# Patient Record
Sex: Male | Born: 1970 | Race: White | Hispanic: No | Smoking: Former smoker
Health system: Southern US, Community
[De-identification: ages and names within clinical notes are randomized; demographics above are authoritative.]

## PROBLEM LIST (undated history)

## (undated) DIAGNOSIS — M2550 Pain in unspecified joint: Secondary | ICD-10-CM

## (undated) DIAGNOSIS — R252 Cramp and spasm: Secondary | ICD-10-CM

## (undated) DIAGNOSIS — M79673 Pain in unspecified foot: Secondary | ICD-10-CM

## (undated) DIAGNOSIS — K219 Gastro-esophageal reflux disease without esophagitis: Secondary | ICD-10-CM

## (undated) DIAGNOSIS — E039 Hypothyroidism, unspecified: Secondary | ICD-10-CM

## (undated) DIAGNOSIS — E559 Vitamin D deficiency, unspecified: Secondary | ICD-10-CM

## (undated) DIAGNOSIS — K829 Disease of gallbladder, unspecified: Secondary | ICD-10-CM

## (undated) DIAGNOSIS — M549 Dorsalgia, unspecified: Secondary | ICD-10-CM

## (undated) DIAGNOSIS — N529 Male erectile dysfunction, unspecified: Secondary | ICD-10-CM

## (undated) HISTORY — DX: Gastro-esophageal reflux disease without esophagitis: K21.9

## (undated) HISTORY — DX: Disease of gallbladder, unspecified: K82.9

## (undated) HISTORY — DX: Hypothyroidism, unspecified: E03.9

## (undated) HISTORY — DX: Dorsalgia, unspecified: M54.9

## (undated) HISTORY — DX: Pain in unspecified foot: M79.673

## (undated) HISTORY — DX: Vitamin D deficiency, unspecified: E55.9

## (undated) HISTORY — PX: ACHILLES TENDON LENGTHENING: SUR826

## (undated) HISTORY — DX: Cramp and spasm: R25.2

## (undated) HISTORY — PX: CHOLECYSTECTOMY: SHX55

## (undated) HISTORY — DX: Pain in unspecified joint: M25.50

## (undated) HISTORY — DX: Male erectile dysfunction, unspecified: N52.9

---

## 2008-06-02 LAB — GLYCO-HEMOGLOBIN A1C
Estimated Average Glucose: 117 mg/dL
Hemoglobin A1C: 5.7 % (ref 4.3–6.1)

## 2008-06-02 LAB — LITHIUM LEVEL: Lithium Level: 0.1 mmol/L — CL (ref 0.5–1.2)

## 2008-06-07 LAB — LITHIUM LEVEL: Lithium Level: 0.2 mmol/L — ABNORMAL LOW (ref 0.5–1.2)

## 2008-07-07 LAB — LITHIUM LEVEL: Lithium Level: 0.4 mmol/L — ABNORMAL LOW (ref 0.5–1.2)

## 2008-09-30 LAB — COMPREHENSIVE METABOLIC PANEL
ALT - Alanine Amino transferase: 38 IU/L (ref 5–40)
AST - Aspartate Aminotransferase: 28 IU/L (ref 10–42)
Albumin/Globulin Ratio: 1.1 (ref >0.9)
Albumin: 4 gm/dL (ref 3.5–5.0)
Alkaline Phosphatase: 60 IU/L (ref 37–107)
Anion Gap: 8 mmol/L (ref 3–11)
BUN: 14 mg/dL (ref 8–21)
Bilirubin, Total: 0.8 mg/dL (ref 0.2–1.2)
CO2 - Carbon Dioxide: 26 mmol/L (ref 22–31)
Calcium: 8.9 mg/dL (ref 8.5–10.5)
Chloride: 106 mmol/L (ref 98–111)
Creatinine, Serum: 1 mg/dL (ref 0.6–1.3)
GFR Estimate: >60 mL/min/{1.73_m2} (ref >60)
Globulin: 3.6 gm/dL (ref 2.2–3.7)
Glucose: 100 mg/dL — ABNORMAL HIGH (ref 80–99)
Potassium: 3.8 mmol/L (ref 3.5–5.1)
Protein, Total: 7.6 gm/dL (ref 6.1–7.9)
Sodium: 140 mmol/L (ref 133–147)

## 2008-09-30 LAB — CBC WITH AUTO DIFFERENTIAL
Basophils %: 0 % (ref 0–2)
Basophils, Absolute: 0 10*3/uL (ref 0–0.2)
Eosinophils %: 1 % (ref 0–7)
Eosinophils, Absolute: 0.1 10*3/uL (ref 0–0.7)
HCT: 45.2 % (ref 42.0–54.0)
Hgb: 15.5 gm/dL (ref 12.0–18.0)
Lymphocytes %: 25 % (ref 25–45)
Lymphocytes, Absolute: 2.5 10*3/uL (ref 1.1–4.3)
MCH: 30.2 pg (ref 27–34)
MCHC: 34.3 gm/dL (ref 32–36)
MCV: 87.9 fL (ref 81–99)
MPV: 7.4 fL (ref 7.4–10.4)
Monocytes %: 8 % (ref 0–12)
Monocytes, Absolute: 0.8 10*3/uL (ref 0–1.2)
Neutrophils, Absolute: 6.3 10*3/uL (ref 1.6–7.3)
Platelet Count: 238 10*3/uL (ref 150–400)
RBC: 5.14 10*6/uL (ref 4.70–6.10)
RDW: 13.1 % (ref 11.5–14.5)
Segs (Neutrophils)%: 66 % (ref 35–70)
WBC: 9.8 10*3/uL (ref 4.8–10.8)

## 2008-09-30 LAB — LITHIUM LEVEL: Lithium Level: 0.2 mmol/L — ABNORMAL LOW (ref 0.5–1.2)

## 2009-01-06 LAB — COMPREHENSIVE METABOLIC PANEL
ALT - Alanine Amino transferase: 43 IU/L — ABNORMAL HIGH (ref 5–40)
AST - Aspartate Aminotransferase: 31 IU/L (ref 10–42)
Albumin/Globulin Ratio: 1.2 (ref >0.9)
Albumin: 4.1 gm/dL (ref 3.5–5.0)
Alkaline Phosphatase: 66 IU/L (ref 37–107)
Anion Gap: 7 mmol/L (ref 3–11)
BUN: 9 mg/dL (ref 8–21)
Bilirubin, Total: 0.6 mg/dL (ref 0.2–1.2)
CO2 - Carbon Dioxide: 26 mmol/L (ref 22–31)
Calcium: 9.5 mg/dL (ref 8.5–10.5)
Chloride: 106 mmol/L (ref 98–111)
Creatinine, Serum: 1.1 mg/dL (ref 0.6–1.3)
GFR Estimate: >60 mL/min/{1.73_m2} (ref >60)
Globulin: 3.5 gm/dL (ref 2.2–3.7)
Glucose: 97 mg/dL (ref 80–99)
Potassium: 4.1 mmol/L (ref 3.5–5.1)
Protein, Total: 7.6 gm/dL (ref 6.1–7.9)
Sodium: 139 mmol/L (ref 133–147)

## 2009-01-06 LAB — LITHIUM LEVEL: Lithium Level: 0.6 mmol/L (ref 0.5–1.2)

## 2009-07-19 LAB — LITHIUM LEVEL: Lithium Level: 0.2 mmol/L — ABNORMAL LOW (ref 0.5–1.2)

## 2009-08-08 LAB — LITHIUM LEVEL: Lithium Level: 0.4 mmol/L — ABNORMAL LOW (ref 0.5–1.2)

## 2009-08-18 ENCOUNTER — Ambulatory Visit: Payer: Self-pay | Admitting: Diagnostic Radiology

## 2009-08-18 ENCOUNTER — Emergency Department (HOSPITAL_BASED_OUTPATIENT_CLINIC_OR_DEPARTMENT_OTHER): Admission: EM | Admit: 2009-08-18 | Discharge: 2009-08-18 | Payer: Self-pay | Admitting: Emergency Medicine

## 2010-01-22 LAB — LITHIUM LEVEL: Lithium Level: <0.1 mmol/L — CL (ref 0.5–1.2)

## 2010-11-08 LAB — LITHIUM LEVEL: Lithium Level: 0.4 mmol/L — ABNORMAL LOW (ref 0.5–1.2)

## 2011-02-28 ENCOUNTER — Emergency Department (HOSPITAL_BASED_OUTPATIENT_CLINIC_OR_DEPARTMENT_OTHER)
Admission: EM | Admit: 2011-02-28 | Discharge: 2011-02-28 | Disposition: A | Discharge status: Home / Self Care | Payer: BC Managed Care – PPO | Attending: Emergency Medicine | Admitting: Emergency Medicine

## 2011-02-28 ENCOUNTER — Other Ambulatory Visit (HOSPITAL_BASED_OUTPATIENT_CLINIC_OR_DEPARTMENT_OTHER): Payer: Self-pay | Admitting: Family Medicine

## 2011-02-28 ENCOUNTER — Emergency Department (INDEPENDENT_AMBULATORY_CARE_PROVIDER_SITE_OTHER): Payer: BC Managed Care – PPO

## 2011-02-28 DIAGNOSIS — R1011 Right upper quadrant pain: Secondary | ICD-10-CM

## 2011-02-28 DIAGNOSIS — R52 Pain, unspecified: Secondary | ICD-10-CM

## 2011-02-28 DIAGNOSIS — R109 Unspecified abdominal pain: Secondary | ICD-10-CM | POA: Insufficient documentation

## 2011-02-28 DIAGNOSIS — F17200 Nicotine dependence, unspecified, uncomplicated: Secondary | ICD-10-CM | POA: Insufficient documentation

## 2011-02-28 DIAGNOSIS — K8020 Calculus of gallbladder without cholecystitis without obstruction: Secondary | ICD-10-CM | POA: Insufficient documentation

## 2011-02-28 LAB — DIFFERENTIAL
Basophils Absolute: 0 10*3/uL (ref 0.0–0.1)
Basophils Relative: 0 % (ref 0–1)
Eosinophils Absolute: 0 10*3/uL (ref 0.0–0.7)
Monocytes Absolute: 0.7 10*3/uL (ref 0.1–1.0)
Monocytes Relative: 4 % (ref 3–12)
Neutro Abs: 12.8 10*3/uL — ABNORMAL HIGH (ref 1.7–7.7)

## 2011-02-28 LAB — COMPREHENSIVE METABOLIC PANEL
Albumin: 4.5 g/dL (ref 3.5–5.2)
BUN: 19 mg/dL (ref 6–23)
Calcium: 10 mg/dL (ref 8.4–10.5)
Chloride: 105 mEq/L (ref 96–112)
Creatinine, Ser: 0.9 mg/dL (ref 0.4–1.5)
GFR calc Af Amer: >60 mL/min (ref >60)
Total Bilirubin: 0.7 mg/dL (ref 0.3–1.2)
Total Protein: 7.8 g/dL (ref 6.0–8.3)

## 2011-02-28 LAB — URINALYSIS, ROUTINE W REFLEX MICROSCOPIC
Ketones, ur: NEGATIVE mg/dL
Nitrite: NEGATIVE
Protein, ur: NEGATIVE mg/dL
Urobilinogen, UA: 0.2 mg/dL (ref 0.0–1.0)

## 2011-02-28 LAB — CBC
MCH: 30.5 pg (ref 26.0–34.0)
MCHC: 34.5 g/dL (ref 30.0–36.0)
Platelets: 238 10*3/uL (ref 150–400)
RDW: 13.2 % (ref 11.5–15.5)

## 2011-03-03 ENCOUNTER — Other Ambulatory Visit (HOSPITAL_BASED_OUTPATIENT_CLINIC_OR_DEPARTMENT_OTHER): Payer: Self-pay

## 2011-03-17 ENCOUNTER — Encounter (HOSPITAL_COMMUNITY): Payer: BC Managed Care – PPO | Attending: Surgery

## 2011-03-17 ENCOUNTER — Other Ambulatory Visit: Payer: Self-pay | Admitting: Surgery

## 2011-03-17 DIAGNOSIS — Z01812 Encounter for preprocedural laboratory examination: Secondary | ICD-10-CM | POA: Insufficient documentation

## 2011-03-17 LAB — CBC
Hemoglobin: 15.7 g/dL (ref 13.0–17.0)
MCV: 93.2 fL (ref 78.0–100.0)
Platelets: 270 10*3/uL (ref 150–400)
RBC: 5.17 MIL/uL (ref 4.22–5.81)
WBC: 10.2 10*3/uL (ref 4.0–10.5)

## 2011-03-17 LAB — SURGICAL PCR SCREEN: Staphylococcus aureus: POSITIVE — AB

## 2011-03-26 ENCOUNTER — Ambulatory Visit (HOSPITAL_COMMUNITY): Payer: BC Managed Care – PPO

## 2011-03-26 ENCOUNTER — Other Ambulatory Visit: Payer: Self-pay | Admitting: Surgery

## 2011-03-26 ENCOUNTER — Ambulatory Visit (HOSPITAL_COMMUNITY)
Admission: RE | Admit: 2011-03-26 | Discharge: 2011-03-26 | Disposition: A | Discharge status: Home / Self Care | Payer: BC Managed Care – PPO | Source: Ambulatory Visit | Attending: Surgery | Admitting: Surgery

## 2011-03-26 DIAGNOSIS — E039 Hypothyroidism, unspecified: Secondary | ICD-10-CM | POA: Insufficient documentation

## 2011-03-26 DIAGNOSIS — E669 Obesity, unspecified: Secondary | ICD-10-CM | POA: Insufficient documentation

## 2011-03-26 DIAGNOSIS — K8000 Calculus of gallbladder with acute cholecystitis without obstruction: Secondary | ICD-10-CM | POA: Insufficient documentation

## 2011-03-26 DIAGNOSIS — K8010 Calculus of gallbladder with chronic cholecystitis without obstruction: Secondary | ICD-10-CM | POA: Insufficient documentation

## 2011-03-26 DIAGNOSIS — Z79899 Other long term (current) drug therapy: Secondary | ICD-10-CM | POA: Insufficient documentation

## 2011-04-07 NOTE — Op Note (Signed)
NAME:  Gregory Schroeder, Gregory Schroeder             ACCOUNT NO.:  [id number]  MEDICAL RECORD NO.:  [id number]           PATIENT TYPE:  O  LOCATION:  DAYL                         FACILITY:  [facility]  PHYSICIAN:  Wilmon Arms. Corliss Skains, M.D. DATE OF BIRTH:  09-01-1971  DATE OF PROCEDURE:  03/26/2011 DATE OF DISCHARGE:  03/26/2011                              OPERATIVE REPORT   PREOPERATIVE DIAGNOSIS:  Chronic calculus cholecystitis.  POSTOPERATIVE DIAGNOSIS:  Chronic calculus cholecystitis.  PROCEDURE PERFORMED:  Laparoscopic cholecystectomy with intraoperative cholangiogram.  SURGEON:  Wilmon Arms. Bahja Bence, M.D.  ANESTHESIA:  General  INDICATIONS:  This is a 40 year old male who presents with several recent gallbladder attacks.  Ultrasound confirmed gallstones but no sign of cholecystitis.  Common bile duct and liver function tests were normal.  He presents now for cholecystectomy.  DESCRIPTION OF PROCEDURE:  The patient was brought to the operating room, placed in supine position on the operating room table.  After an adequate level of general anesthesia was obtained, the patient's abdomen was prepped with ChloraPrep and draped in sterile fashion.  Time-out was taken to assure the proper patient, proper procedure.  We infiltrated the area above the umbilicus with 0.25% Marcaine with epinephrine.  A transverse incision was made.  Dissection was carried down through the subcutaneous tissues with cautery and blunt dissection.  We incised the fascia vertically.  We entered the peritoneal cavity bluntly.  A stay suture of 0 Vicryl was placed around the fascial opening. Pneumoperitoneum was obtained by insufflating CO2 maintaining maximal pressure of 15 mmHg.  The laparoscope was inserted and the patient was positioned reverse Trendelenburg, tilted to his left.  An 11-mm port was placed in the subxiphoid position.  Two 5-mm ports were placed in the right upper quadrant.  The gallbladder was grasped with a  clamp and lifted.  There were lot of adhesions to the gallbladder.  We opened the peritoneum around the hilum of the gallbladder and dissected around the cystic duct and cystic artery.  The cystic artery was ligated with clips and divided.  The cystic duct was ligated, clipped distally.  A small opening was created in the cystic duct.  A Cook cholangiogram catheter was inserted through a stab incision threading the cystic duct.  A cholangiogram was obtained which showed good flow proximally and distally in biliary tree, no sign of filling defects.  Contrast flowed easily into duodenum.  The catheter was removed and cystic duct was ligated with clips and divided.  The gallbladder was then dissected free from the liver.  This was fairly difficult due to the thickened nature of the gallbladder.  The gallbladder was placed in EndoCatch sac.  We thoroughly irrigated the gallbladder fossa and used cautery for hemostasis.  A piece of Surgicel snow was packed in the gallbladder fossa.  The gallbladder EndoCatch sac was then removed.  The pursestring suture was used to close the umbilical fascia.  The 4-0 Monocryl was used to close the skin incisions.  Steri-Strips and clean dressings were applied.  The patient was extubated and brought to recovery room in stable condition. All sponge, instrument, and needle counts were correct.  Wilmon Arms. Corliss Skains, M.D.     MKT/MEDQ  D:  03/26/2011  T:  03/27/2011  Job:  [id number]  Electronically Signed by Manus Rudd M.D. on 04/07/2011 10:41:04 AM

## 2012-06-04 IMAGING — US US ABDOMEN COMPLETE
1 series · 13 of 25 positions shown · non-contrast
Comparison: None

CLINICAL DATA: 40-year-old male with abdominal pain.

ABDOMINAL ULTRASOUND COMPLETE

[Series 1: us abdomen complete · 0.43mm/px · 13 of 70 slices shown]
[im 1/70]
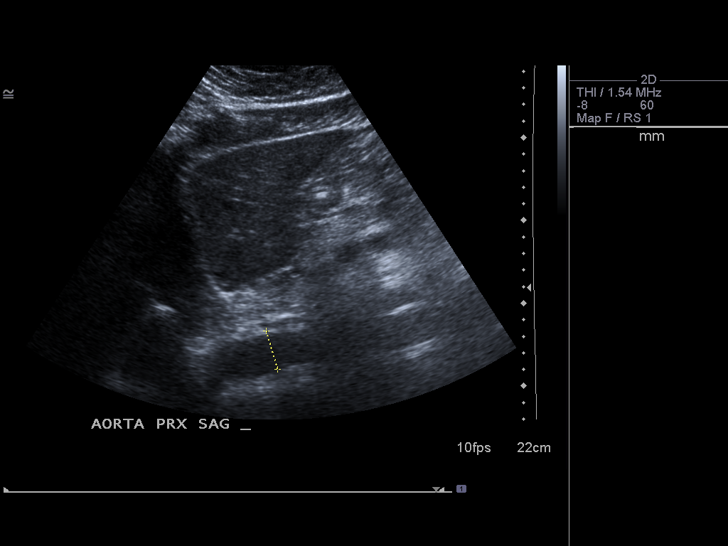
[im 6/70]
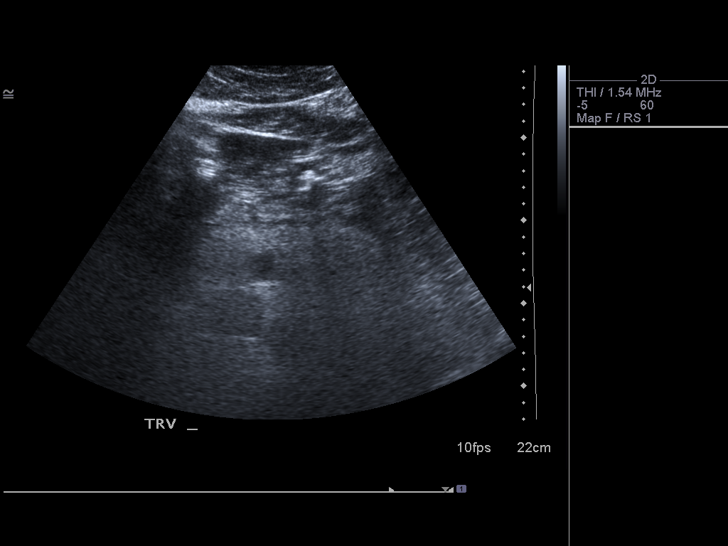
[im 12/70]
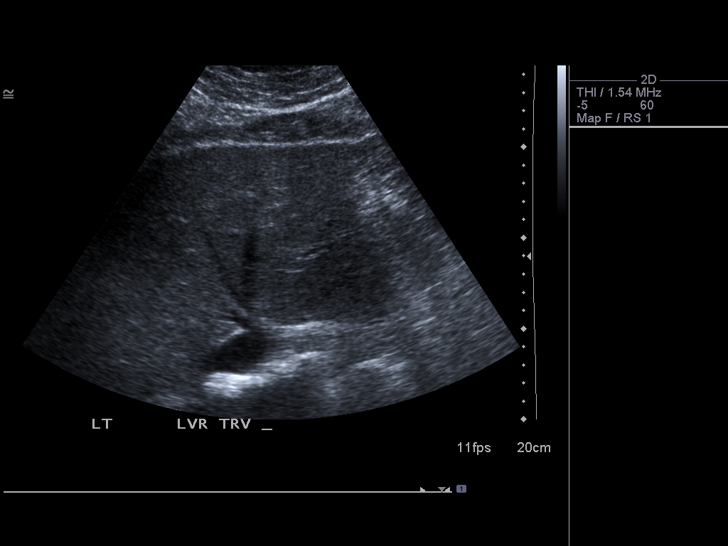
[im 18/70]
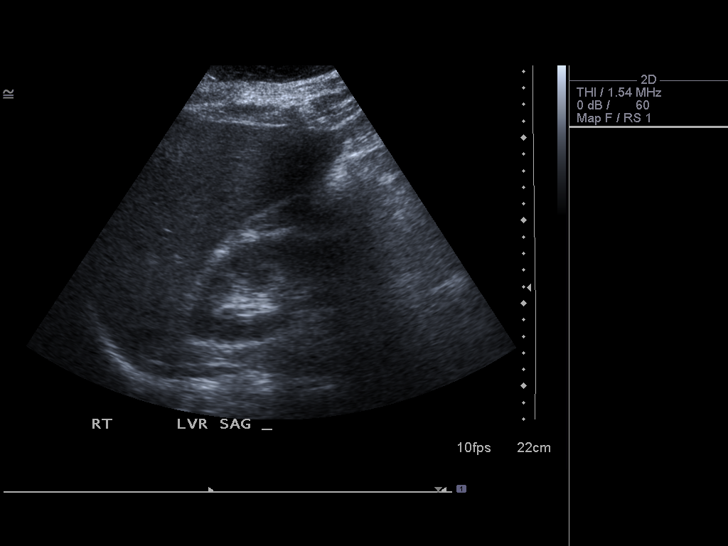
[im 24/70]
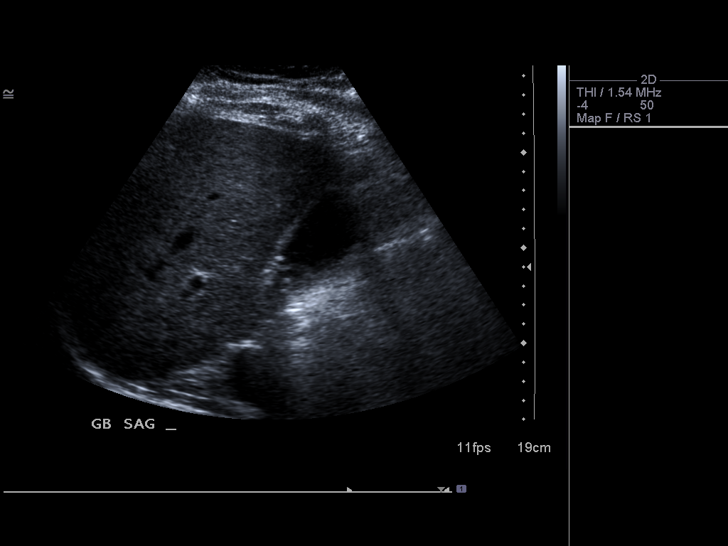
[im 29/70]
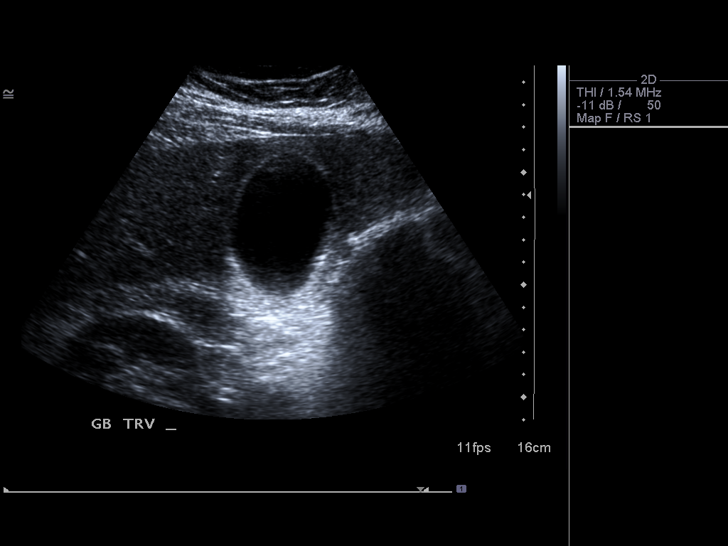
[im 35/70]
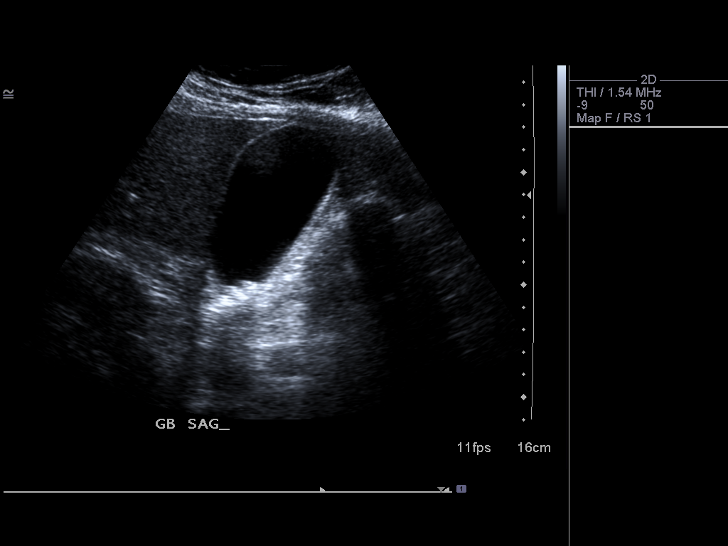
[im 41/70]
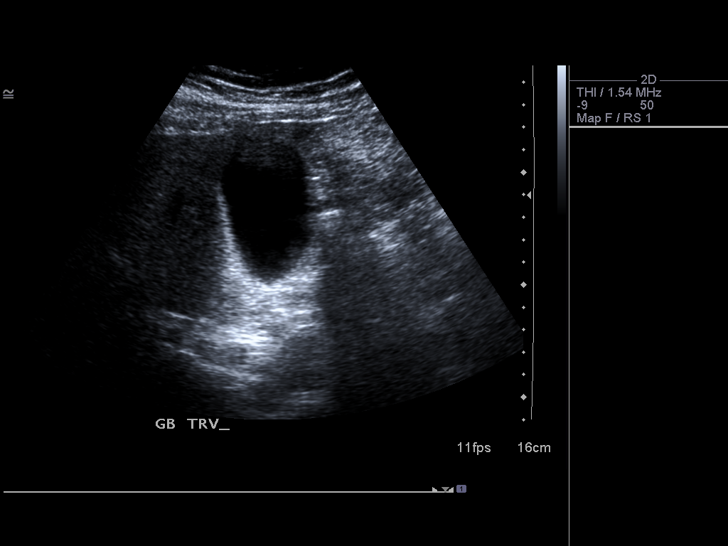
[im 47/70]
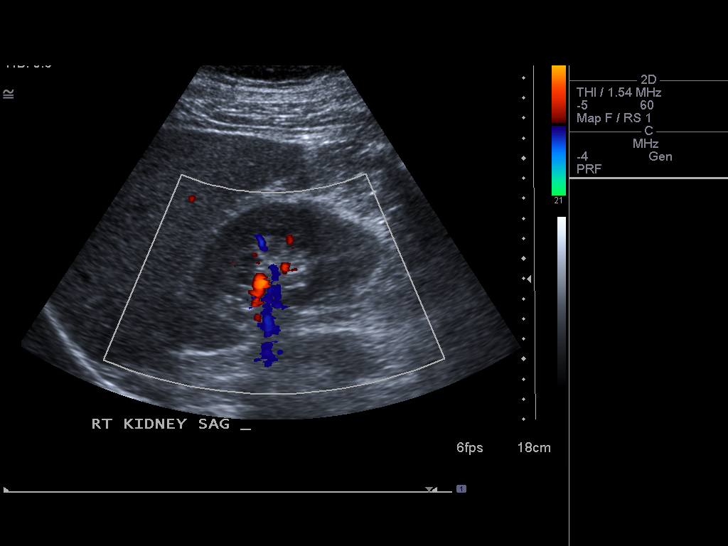
[im 52/70]
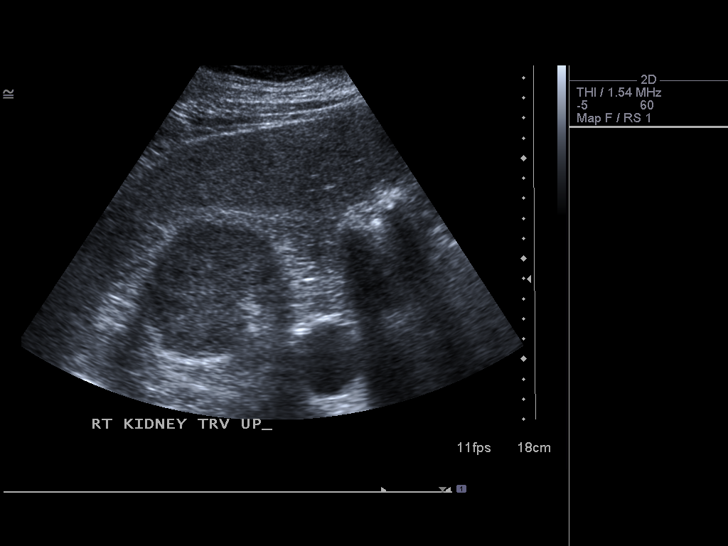
[im 58/70]
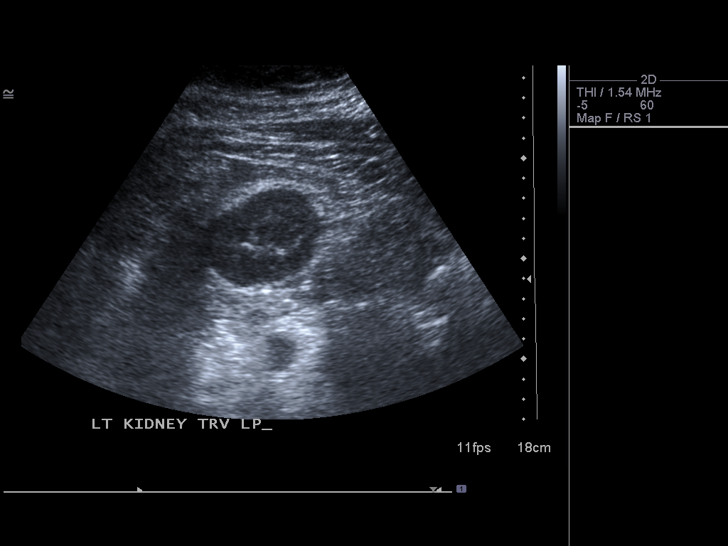
[im 64/70]
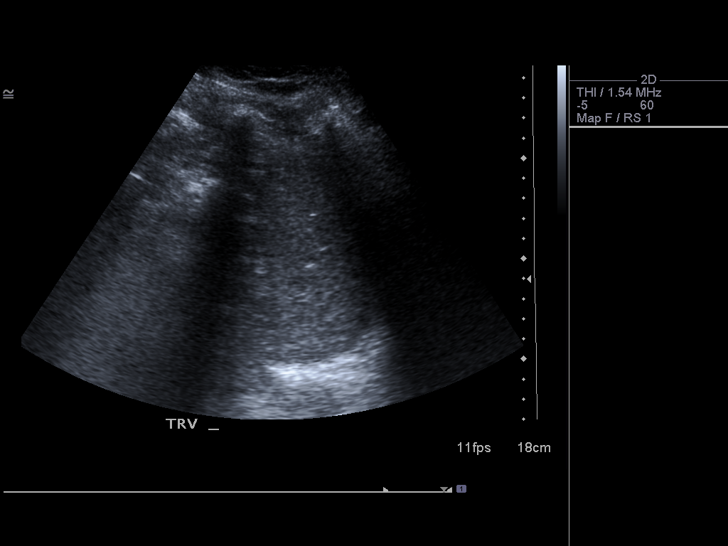
[im 70/70]
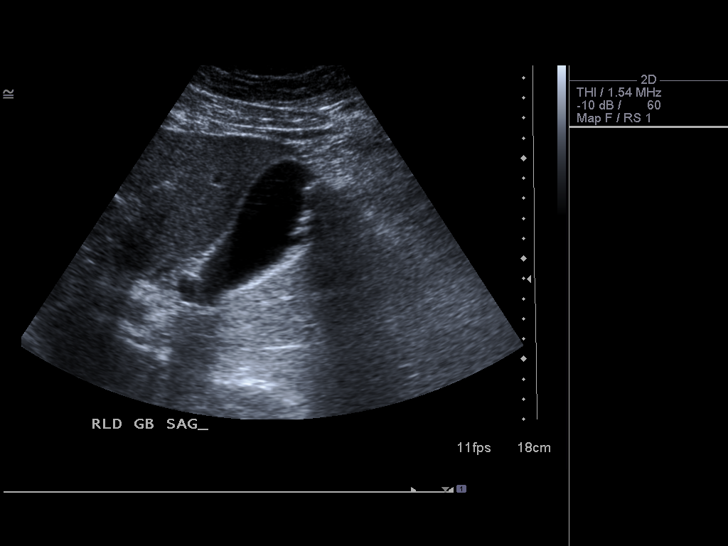

[13 of 25 positions shown; findings below may reference images not displayed]

FINDINGS: Gallbladder: Multiple tiny mobile gallstones are identified.  There
is no evidence of gallbladder wall thickening, pericholecystic
fluid or sonographic Murphy's sign.

Common Bile Duct:  There is no evidence of intrahepatic or
extrahepatic biliary dilation. The CBD measures 3.5 mm in greatest
diameter.

Liver:  The liver is within normal limits in parenchymal
echogenicity. No focal abnormalities are identified.

IVC:  Appears normal.

Pancreas:  Although the pancreas is difficult to visualize in its
entirety, no focal pancreatic abnormality is identified.

Spleen:  Within normal limits in size and echotexture.

Right kidney:  The right kidney is normal in size and parenchymal
echogenicity.  There is no evidence of solid mass, hydronephrosis
or definite renal calculi.  The right kidney measures 11.7 cm.

Left kidney:  The left kidney is normal in size and parenchymal
echogenicity.  There is no evidence of solid mass, hydronephrosis
or definite renal calculi.   The left kidney measures 12.6 cm.

Abdominal Aorta:  No abdominal aortic aneurysm identified.

There is no evidence of ascites.
IMPRESSION: Cholelithiasis without evidence of acute cholecystitis.

No other significant abnormalities.

## 2012-06-30 IMAGING — RF DG CHOLANGIOGRAM OPERATIVE
1 series · 4 of 4 positions shown · non-contrast
Comparison: None

CLINICAL DATA: Cholelithiasis

INTRAOPERATIVE CHOLANGIOGRAM
TECHNIQUE: Cholangiographic images from the C-arm fluoroscopic
device were submitted for interpretation post-operatively.  Please
see the procedural report for the amount of contrast and the
fluoroscopy time utilized.

[Series 1: run · 4 of 97 frames shown]
[frame 15/97]
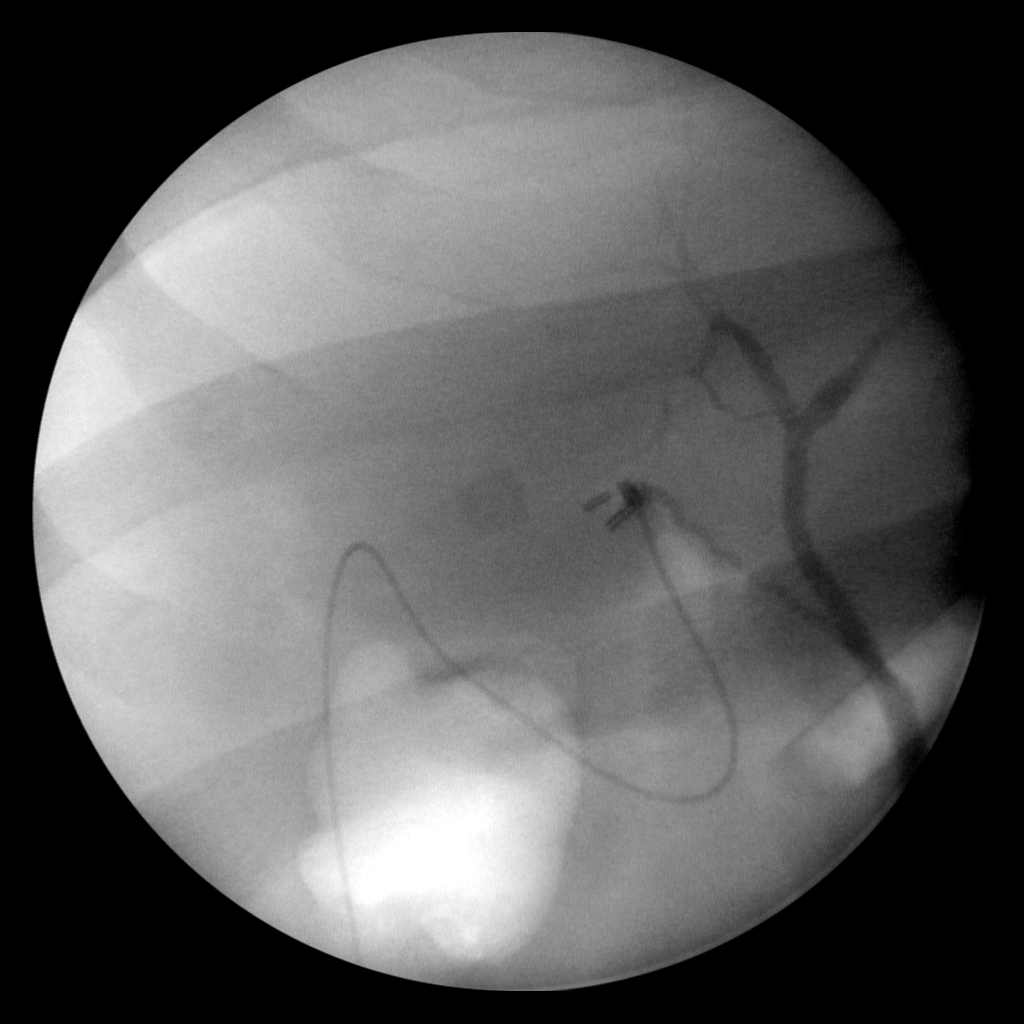
[frame 49/97]
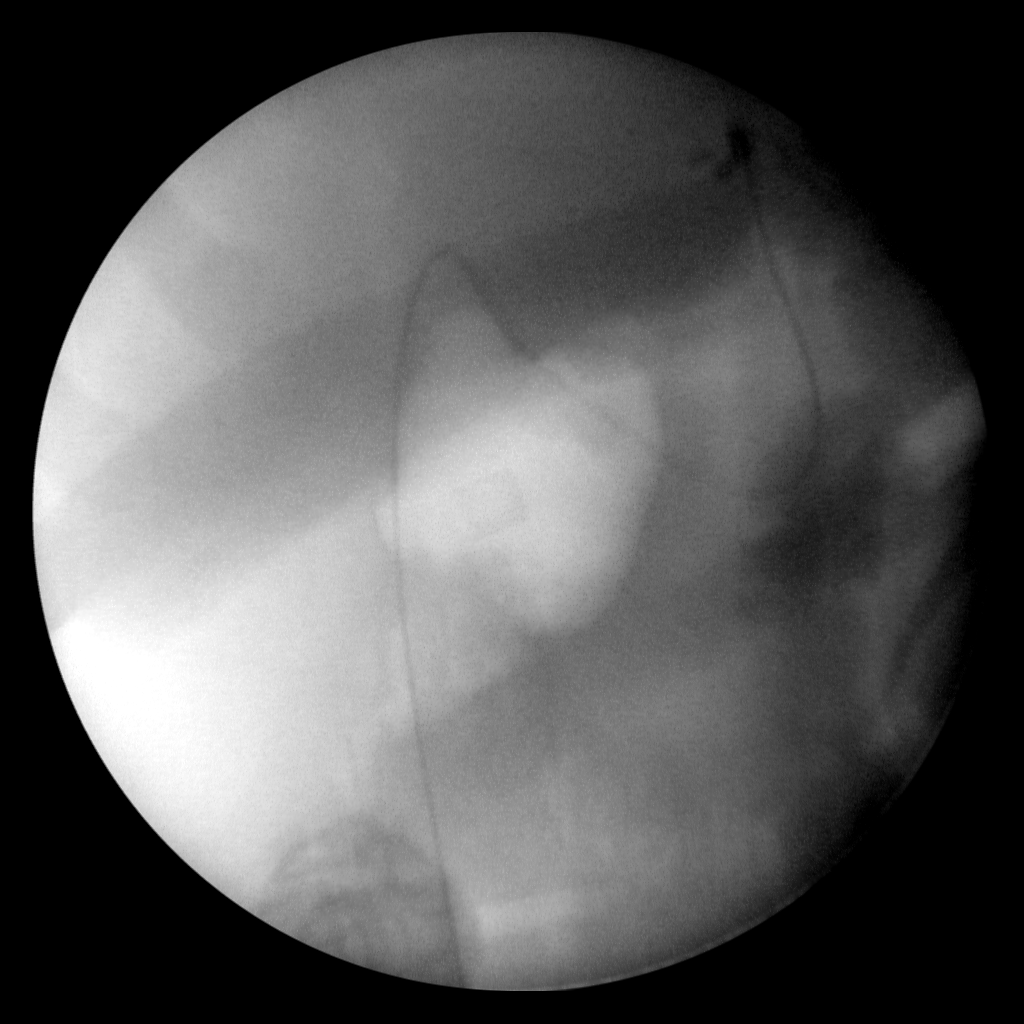
[frame 81/97]
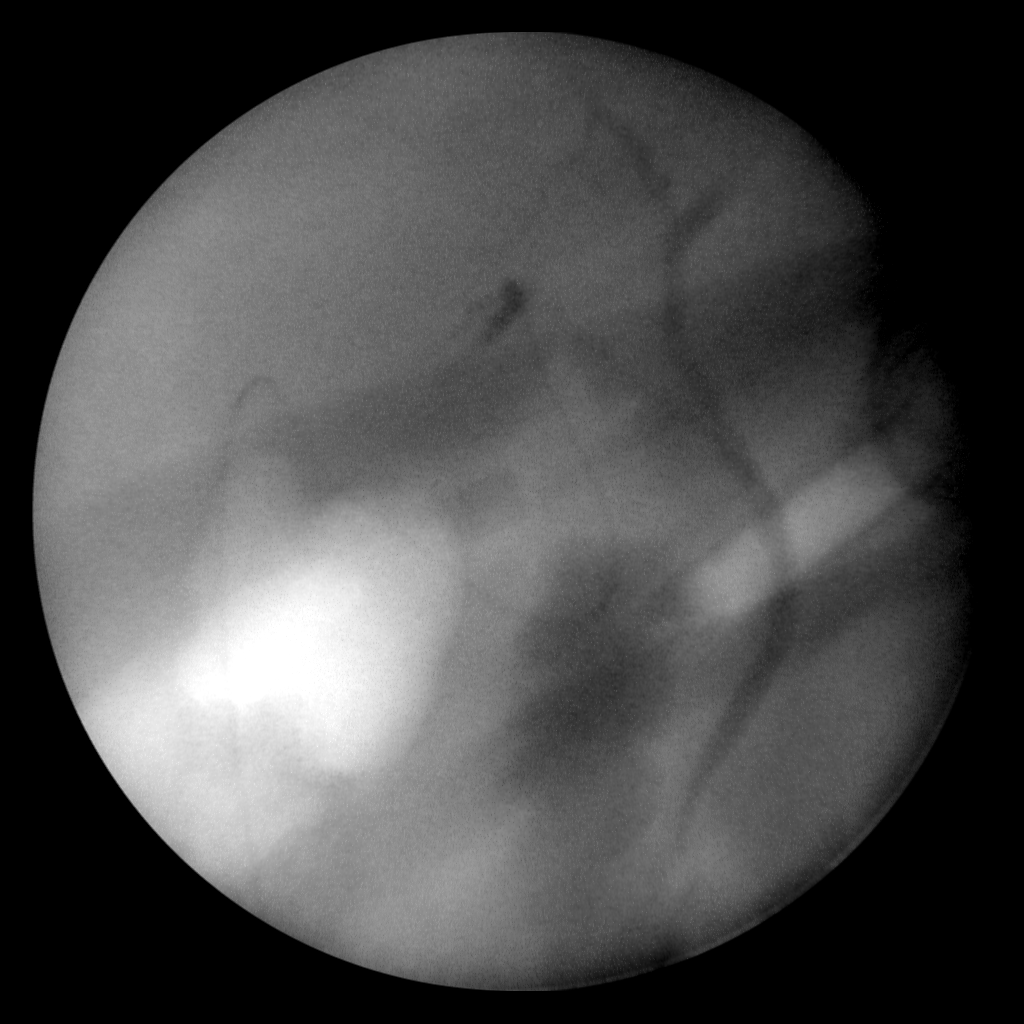
[frame 83/97]
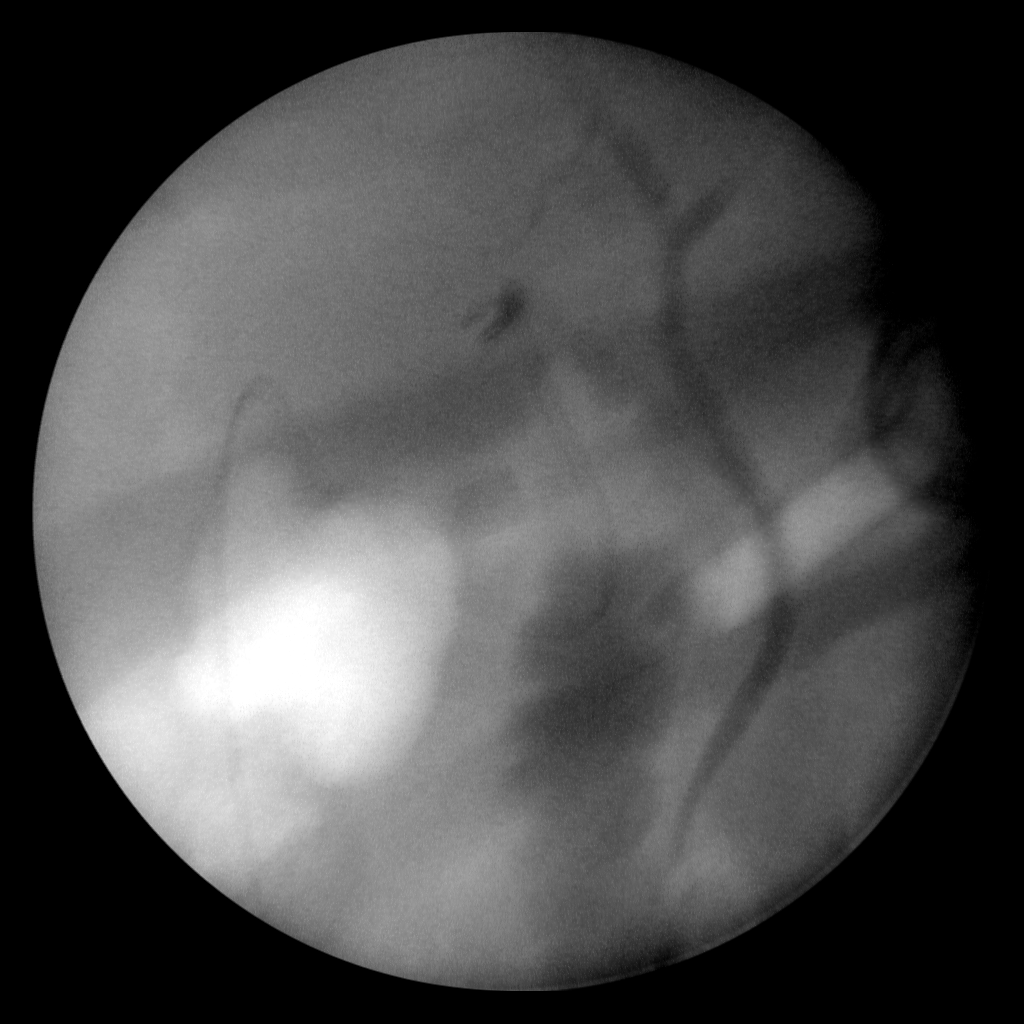

[4 of 4 positions shown; findings below may reference images not displayed]

FINDINGS: No persistent filling defects in the common duct.
Intrahepatic ducts are incompletely visualized, appearing
decompressed centrally. Contrast passes into the duodenum.

IMPRESSION

Negative for retained common duct stone.

## 2012-12-03 ENCOUNTER — Institutional Professional Consult (permissible substitution): Payer: PRIVATE HEALTH INSURANCE | Attending: PT

## 2012-12-20 ENCOUNTER — Encounter
Admit: 2012-12-20 | Discharge: 2012-12-20 | Payer: PRIVATE HEALTH INSURANCE | Attending: Rehabilitative and Restorative Service Providers"

## 2012-12-20 ENCOUNTER — Encounter
Admit: 2012-12-20 | Discharge: 2012-12-21 | Payer: PRIVATE HEALTH INSURANCE | Attending: Rehabilitative and Restorative Service Providers"

## 2012-12-27 ENCOUNTER — Encounter: Attending: Rehabilitative and Restorative Service Providers"

## 2012-12-28 ENCOUNTER — Encounter
Admit: 2012-12-28 | Discharge: 2012-12-29 | Payer: PRIVATE HEALTH INSURANCE | Attending: Rehabilitative and Restorative Service Providers"

## 2012-12-29 ENCOUNTER — Encounter
Admit: 2012-12-29 | Discharge: 2012-12-29 | Payer: PRIVATE HEALTH INSURANCE | Attending: Rehabilitative and Restorative Service Providers"

## 2012-12-30 ENCOUNTER — Encounter
Admit: 2012-12-30 | Discharge: 2012-12-30 | Payer: PRIVATE HEALTH INSURANCE | Attending: Rehabilitative and Restorative Service Providers"

## 2012-12-31 ENCOUNTER — Encounter
Admit: 2012-12-31 | Discharge: 2012-12-31 | Payer: PRIVATE HEALTH INSURANCE | Attending: Rehabilitative and Restorative Service Providers"

## 2013-01-03 ENCOUNTER — Encounter
Admit: 2013-01-03 | Discharge: 2013-01-03 | Payer: PRIVATE HEALTH INSURANCE | Attending: Rehabilitative and Restorative Service Providers"

## 2013-01-04 ENCOUNTER — Encounter
Admit: 2013-01-04 | Discharge: 2013-01-04 | Payer: PRIVATE HEALTH INSURANCE | Attending: Rehabilitative and Restorative Service Providers"

## 2013-01-05 ENCOUNTER — Encounter
Admit: 2013-01-05 | Discharge: 2013-01-05 | Payer: PRIVATE HEALTH INSURANCE | Attending: Rehabilitative and Restorative Service Providers"

## 2013-01-06 ENCOUNTER — Encounter
Admit: 2013-01-06 | Discharge: 2013-01-06 | Payer: PRIVATE HEALTH INSURANCE | Attending: Rehabilitative and Restorative Service Providers"

## 2013-01-07 ENCOUNTER — Encounter
Admit: 2013-01-07 | Discharge: 2013-01-07 | Payer: PRIVATE HEALTH INSURANCE | Attending: Rehabilitative and Restorative Service Providers"

## 2013-01-10 ENCOUNTER — Encounter
Admit: 2013-01-10 | Discharge: 2013-01-10 | Payer: PRIVATE HEALTH INSURANCE | Attending: Rehabilitative and Restorative Service Providers"

## 2013-01-11 ENCOUNTER — Encounter
Admit: 2013-01-11 | Discharge: 2013-01-11 | Payer: PRIVATE HEALTH INSURANCE | Attending: Rehabilitative and Restorative Service Providers"

## 2013-01-12 ENCOUNTER — Encounter
Admit: 2013-01-12 | Discharge: 2013-01-12 | Payer: PRIVATE HEALTH INSURANCE | Attending: Rehabilitative and Restorative Service Providers"

## 2013-01-13 ENCOUNTER — Encounter
Admit: 2013-01-13 | Discharge: 2013-01-13 | Payer: PRIVATE HEALTH INSURANCE | Attending: Rehabilitative and Restorative Service Providers"

## 2013-01-14 ENCOUNTER — Encounter
Admit: 2013-01-14 | Discharge: 2013-01-14 | Payer: PRIVATE HEALTH INSURANCE | Attending: Rehabilitative and Restorative Service Providers"

## 2013-01-17 ENCOUNTER — Encounter
Admit: 2013-01-17 | Discharge: 2013-01-17 | Payer: PRIVATE HEALTH INSURANCE | Attending: Rehabilitative and Restorative Service Providers"

## 2013-01-18 ENCOUNTER — Encounter
Admit: 2013-01-18 | Discharge: 2013-01-18 | Payer: PRIVATE HEALTH INSURANCE | Attending: Rehabilitative and Restorative Service Providers"

## 2013-01-19 ENCOUNTER — Encounter
Admit: 2013-01-19 | Discharge: 2013-01-19 | Payer: PRIVATE HEALTH INSURANCE | Attending: Rehabilitative and Restorative Service Providers"

## 2013-01-20 ENCOUNTER — Encounter
Admit: 2013-01-20 | Discharge: 2013-01-20 | Payer: PRIVATE HEALTH INSURANCE | Attending: Rehabilitative and Restorative Service Providers"

## 2013-01-21 ENCOUNTER — Encounter
Admit: 2013-01-21 | Discharge: 2013-01-21 | Payer: PRIVATE HEALTH INSURANCE | Attending: Rehabilitative and Restorative Service Providers"

## 2013-01-24 ENCOUNTER — Encounter: Attending: Rehabilitative and Restorative Service Providers"

## 2013-01-25 ENCOUNTER — Encounter: Attending: Rehabilitative and Restorative Service Providers"

## 2013-01-26 ENCOUNTER — Encounter: Attending: Rehabilitative and Restorative Service Providers"

## 2013-01-27 ENCOUNTER — Encounter: Attending: Rehabilitative and Restorative Service Providers"

## 2013-01-28 ENCOUNTER — Encounter: Attending: Rehabilitative and Restorative Service Providers"

## 2013-01-31 ENCOUNTER — Encounter: Attending: Rehabilitative and Restorative Service Providers"

## 2013-02-01 ENCOUNTER — Encounter: Attending: Rehabilitative and Restorative Service Providers"

## 2013-02-02 ENCOUNTER — Encounter: Attending: Rehabilitative and Restorative Service Providers"

## 2013-02-03 ENCOUNTER — Encounter: Attending: Rehabilitative and Restorative Service Providers"

## 2013-02-04 ENCOUNTER — Encounter: Attending: Rehabilitative and Restorative Service Providers"

## 2013-02-07 ENCOUNTER — Encounter: Attending: Rehabilitative and Restorative Service Providers"

## 2013-02-07 ENCOUNTER — Inpatient Hospital Stay: Admit: 2013-02-07 | Discharge: 2013-02-07 | Disposition: A | Discharge status: Home / Self Care | Attending: MD

## 2013-02-07 ENCOUNTER — Emergency Department: Admit: 2013-02-07

## 2013-02-07 DIAGNOSIS — G4700 Insomnia, unspecified: Secondary | ICD-10-CM

## 2013-02-07 MED ORDER — hydrOXYzine pamoate (VISTARIL) 50 MG capsule
50 | ORAL_CAPSULE | Freq: Every evening | ORAL | 0.00 refills | 30.00000 days | Status: DC | PRN
Start: 2013-02-07 — End: 2015-09-25

## 2013-02-08 ENCOUNTER — Encounter: Attending: Rehabilitative and Restorative Service Providers"

## 2013-02-09 ENCOUNTER — Encounter: Attending: Rehabilitative and Restorative Service Providers"

## 2013-02-10 ENCOUNTER — Encounter: Attending: Rehabilitative and Restorative Service Providers"

## 2013-02-11 ENCOUNTER — Encounter: Admit: 2013-02-11 | Discharge: 2013-02-11 | Attending: MD

## 2013-02-11 ENCOUNTER — Encounter: Attending: Rehabilitative and Restorative Service Providers"

## 2013-02-14 ENCOUNTER — Encounter: Attending: Rehabilitative and Restorative Service Providers"

## 2013-02-15 ENCOUNTER — Encounter: Attending: Rehabilitative and Restorative Service Providers"

## 2013-02-27 ENCOUNTER — Emergency Department: Admit: 2013-02-28

## 2013-02-27 DIAGNOSIS — F309 Manic episode, unspecified: Secondary | ICD-10-CM

## 2013-02-28 ENCOUNTER — Inpatient Hospital Stay
Admit: 2013-02-28 | Discharge: 2013-02-28 | Disposition: A | Discharge status: Home / Self Care | Attending: Emergency Medical Services

## 2013-02-28 LAB — URINE DRUG SCREEN 8
Amphetamine: NEGATIVE
Barbiturates: NEGATIVE
Benzodiazepines: NEGATIVE
Cocaine: NEGATIVE
Creatinine Drug Screen: 121 mg/dL
Marijuana: NEGATIVE
Methadone Screen, urine: NEGATIVE
Opiates: NEGATIVE
Oxycodone: NEGATIVE
pH Drug Screen: 7 (ref 5.0–8.5)

## 2013-02-28 LAB — ALCOHOL, SERUM: Alcohol Scrn: <5 mg/dL (ref <5)

## 2013-02-28 LAB — COMPREHENSIVE METABOLIC PANEL
ALT - Alanine Amino transferase: 39 IU/L (ref 13–60)
AST - Aspartate Aminotransferase: 33 IU/L (ref 10–42)
Albumin/Globulin Ratio: 1.2 (ref >0.9)
Albumin: 3.8 gm/dL (ref 3.5–5.0)
Alkaline Phosphatase: 66 IU/L (ref 37–107)
Anion Gap: 5 mmol/L (ref 3–11)
BUN: 11 mg/dL (ref 8–21)
Bilirubin, Total: 0.4 mg/dL (ref 0.2–1.4)
CO2 - Carbon Dioxide: 24 mmol/L (ref 22–31)
Calcium: 8.6 mg/dL (ref 8.6–10.0)
Chloride: 110 mmol/L (ref 98–111)
Creatinine, Serum: 0.77 mg/dL (ref 0.64–1.27)
GFR Estimate: >60 mL/min/{1.73_m2} (ref >60)
Globulin: 3.3 gm/dL (ref 2.2–3.7)
Glucose: 102 mg/dL — ABNORMAL HIGH (ref 80–99)
Potassium: 4 mmol/L (ref 3.5–5.1)
Protein, Total: 7.1 gm/dL (ref 6.1–7.9)
Sodium: 139 mmol/L (ref 135–143)

## 2013-02-28 LAB — POCT GLUCOSE: POC Glucose: 120 mg/dL — ABNORMAL HIGH (ref 80–99)

## 2013-02-28 LAB — CBC WITH AUTO DIFFERENTIAL
Basophils %: 1 % (ref 0–2)
Basophils, Absolute: 0.1 10*3/uL (ref 0–0.2)
Eosinophils %: 1 % (ref 0–7)
Eosinophils, Absolute: 0.1 10*3/uL (ref 0–0.7)
HCT: 41.8 % — ABNORMAL LOW (ref 42.0–54.0)
Hgb: 14.6 gm/dL (ref 12.0–18.0)
Lymphocytes %: 19 % — ABNORMAL LOW (ref 25–45)
Lymphocytes, Absolute: 2 10*3/uL (ref 1.1–4.3)
MCH: 30.8 pg (ref 27–34)
MCHC: 35 gm/dL (ref 32–36)
MCV: 88.1 fL (ref 81–99)
MPV: 8 fL (ref 7.4–10.4)
Monocytes %: 11 % (ref 0–12)
Monocytes, Absolute: 1.1 10*3/uL (ref 0–1.2)
Neutrophils, Absolute: 7.3 10*3/uL (ref 1.6–7.3)
Platelet Count: 230 10*3/uL (ref 150–400)
RBC: 4.75 10*6/uL (ref 4.70–6.10)
RDW: 12.8 % (ref 11.5–14.5)
Segs (Neutrophils)%: 68 % (ref 35–70)
WBC: 10.5 10*3/uL (ref 4.8–10.8)

## 2013-02-28 LAB — URINALYSIS, ROUTINE
Bilirubin, urine: NEGATIVE
Blood, urine: NEGATIVE
Glucose, UA: NEGATIVE mg/dl
Ketones, urine: NEGATIVE
Leukocyte Esterase, urine: NEGATIVE
Nitrite, urine: NEGATIVE
Protein, urine: NEGATIVE
Specific Gravity, urine: 1.018 (ref 1.003–1.030)
Urobilinogen, urine: NEGATIVE EU/dL
pH, UA: 6 (ref 5.0–8.5)

## 2013-02-28 LAB — T4, FREE: T4, Free: 0.7 ng/dL (ref 0.6–1.2)

## 2013-02-28 LAB — TSH: TSH - Thyroid Stimulating Hormone: 0.72 microIU/mL (ref 0.40–5.80)

## 2013-02-28 LAB — NEISSERIA GONORRHOEAE BY PCR, URINE: GC DNA Probe Result: NOT DETECTED

## 2013-02-28 LAB — CHLAMYDIA BY PCR (URINE): Chlamydia Trachomatis DNA Probe: NOT DETECTED

## 2013-02-28 MED ORDER — LORazepam (ATIVAN) tablet 2 mg
1 | ORAL | Status: DC | PRN
Start: 2013-02-28 — End: 2013-02-27
  Administered 2013-02-28: 05:00:00 1 mg via ORAL

## 2013-02-28 MED FILL — LORAZEPAM 1 MG TABLET: 1 mg | ORAL | Qty: 2 | Fill #0

## 2013-03-03 ENCOUNTER — Inpatient Hospital Stay: Admit: 2013-03-03 | Discharge: 2013-03-03 | Discharge status: Against Medical Advice

## 2013-03-03 DIAGNOSIS — Z711 Person with feared health complaint in whom no diagnosis is made: Secondary | ICD-10-CM

## 2013-03-17 ENCOUNTER — Inpatient Hospital Stay: Admit: 2013-03-17

## 2013-03-17 ENCOUNTER — Inpatient Hospital Stay

## 2013-03-17 DIAGNOSIS — M545 Low back pain, unspecified: Secondary | ICD-10-CM

## 2013-03-17 DIAGNOSIS — M47817 Spondylosis without myelopathy or radiculopathy, lumbosacral region: Secondary | ICD-10-CM

## 2014-02-20 ENCOUNTER — Inpatient Hospital Stay
Admit: 2014-02-20 | Discharge: 2014-02-20 | Disposition: A | Discharge status: Home / Self Care | Payer: PRIVATE HEALTH INSURANCE | Attending: Emergency Medical Services

## 2014-02-20 ENCOUNTER — Emergency Department: Admit: 2014-02-20 | Payer: PRIVATE HEALTH INSURANCE

## 2014-02-20 DIAGNOSIS — N201 Calculus of ureter: Secondary | ICD-10-CM

## 2014-02-20 LAB — CBC WITH AUTO DIFFERENTIAL
Basophils %: 1 % (ref 0–2)
Basophils, Absolute: 0.1 10*3/uL (ref 0–0.2)
Eosinophils %: 1 % (ref 0–7)
Eosinophils, Absolute: 0.1 10*3/uL (ref 0–0.7)
HCT: 47.5 % (ref 42.0–54.0)
Hgb: 15.8 gm/dL (ref 12.0–18.0)
Lymphocytes %: 36 % (ref 25–45)
Lymphocytes, Absolute: 2.9 10*3/uL (ref 1.1–4.3)
MCH: 28.5 pg (ref 27–34)
MCHC: 33.2 gm/dL (ref 32–36)
MCV: 85.9 fL (ref 81–99)
MPV: 7.6 fL (ref 7.4–10.4)
Monocytes %: 14 % — ABNORMAL HIGH (ref 0–12)
Monocytes, Absolute: 1.1 10*3/uL (ref 0–1.2)
Neutrophils, Absolute: 3.8 10*3/uL (ref 1.6–7.3)
Platelet Count: 237 10*3/uL (ref 150–400)
RBC: 5.53 10*6/uL (ref 4.70–6.10)
RDW: 12.9 % (ref 11.5–14.5)
Segs (Neutrophils)%: 48 % (ref 35–70)
WBC: 7.9 10*3/uL (ref 4.8–10.8)

## 2014-02-20 LAB — COMPREHENSIVE METABOLIC PANEL
ALT - Alanine Amino transferase: 46 IU/L (ref 13–60)
AST - Aspartate Aminotransferase: 31 IU/L (ref 10–42)
Albumin/Globulin Ratio: 1.1 (ref >0.9)
Albumin: 3.9 gm/dL (ref 3.5–5.0)
Alkaline Phosphatase: 67 IU/L (ref 37–107)
Anion Gap: 9 mmol/L (ref 3–11)
BUN: 9 mg/dL (ref 8–21)
Bilirubin, Total: 0.7 mg/dL (ref 0.2–1.4)
CO2 - Carbon Dioxide: 24 mmol/L (ref 22–31)
Calcium: 9.3 mg/dL (ref 8.6–10.0)
Chloride: 104 mmol/L (ref 98–111)
Creatinine, Serum: 0.86 mg/dL (ref 0.64–1.27)
GFR Estimate: >60 mL/min/{1.73_m2} (ref >60)
Globulin: 3.7 gm/dL (ref 2.2–3.7)
Glucose: 116 mg/dL — ABNORMAL HIGH (ref 80–99)
Potassium: 3.6 mmol/L (ref 3.5–5.1)
Protein, Total: 7.6 gm/dL (ref 6.1–7.9)
Sodium: 137 mmol/L (ref 135–143)

## 2014-02-20 LAB — URINALYSIS, ROUTINE
Bacteria, UA: 1 /HPF (ref 0–92)
Bilirubin, urine: NEGATIVE
Glucose, UA: NEGATIVE mg/dl
Hyaline Casts, UA: 2 /LPF (ref 0–5)
Ketones, urine: NEGATIVE
Leukocyte Esterase, urine: NEGATIVE
Nitrite, urine: NEGATIVE
Protein, urine: NEGATIVE
RBC, UA: 11 /HPF (ref 0–5)
Specific Gravity, urine: 1.011 (ref 1.003–1.030)
Squamous Epithelial, UA: 0 /HPF (ref 0–5)
Urobilinogen, urine: NEGATIVE EU/dL
WBC, UA: 1 /HPF (ref 0–9)
pH, UA: 5 (ref 5.0–8.5)

## 2014-02-20 LAB — LIPASE: Lipase: 29 U/L (ref 15–50)

## 2014-02-20 MED ORDER — ketorolac (TORADOL) injection 15 mg
30 | Freq: Once | INTRAMUSCULAR | Status: AC
Start: 2014-02-20 — End: 2014-02-20
  Administered 2014-02-20: 14:00:00 30 mg via INTRAVENOUS

## 2014-02-20 MED ORDER — sodium chloride 0.9% (NS) syringe 10 mL
INTRAMUSCULAR | Status: DC | PRN
Start: 2014-02-20 — End: 2014-02-20

## 2014-02-20 MED ORDER — tamsulosin (FLOMAX) 0.4 mg Cp24
0.4 | ORAL_CAPSULE | Freq: Every day | ORAL | 1.00 refills | 90.00000 days | Status: DC
Start: 2014-02-20 — End: 2014-03-24

## 2014-02-20 MED ORDER — sodium chloride 0.9 % bolus 1,000 mL
Freq: Once | INTRAVENOUS | Status: AC
Start: 2014-02-20 — End: 2014-02-20
  Administered 2014-02-20 (×2): via INTRAVENOUS

## 2014-02-20 MED ORDER — HYDROmorphone (DILAUDID) syringe 1 mg
1 | INTRAMUSCULAR | Status: DC | PRN
Start: 2014-02-20 — End: 2014-02-20
  Administered 2014-02-20: 14:00:00 1 mg via INTRAVENOUS

## 2014-02-20 MED ORDER — sodium chloride 0.9 % bolus 1,000 mL
Freq: Once | INTRAVENOUS | Status: AC
Start: 2014-02-20 — End: 2014-02-20
  Administered 2014-02-20 (×2): via INTRAVENOUS

## 2014-02-20 MED ORDER — ondansetron (ZOFRAN) injection 4 mg
4 | INTRAMUSCULAR | Status: DC | PRN
Start: 2014-02-20 — End: 2014-02-20
  Administered 2014-02-20: 14:00:00 4 mg via INTRAVENOUS

## 2014-02-20 MED ORDER — tamsulosin (FLOMAX) 24 hr capsule 0.4 mg
0.4 | Freq: Every day | ORAL | Status: DC
Start: 2014-02-20 — End: 2014-02-20

## 2014-02-20 MED ORDER — ondansetron (ZOFRAN) 4 MG tablet
4 | ORAL_TABLET | Freq: Four times a day (QID) | ORAL | Status: AC
Start: 2014-02-20 — End: 2014-02-27

## 2014-02-20 MED ORDER — naproxen (NAPROSYN) 500 MG tablet
500 | ORAL_TABLET | Freq: Two times a day (BID) | ORAL | 0.00 refills | Status: AC
Start: 2014-02-20 — End: 2014-02-27

## 2014-02-20 MED ORDER — oxyCODONE-acetaminophen (PERCOCET) 5-325 mg per tablet
5-325 | ORAL_TABLET | ORAL | 0.00 refills | 28.00000 days | Status: AC | PRN
Start: 2014-02-20 — End: 2014-03-02

## 2014-02-20 MED ORDER — tamsulosin (FLOMAX) 0.4 mg 24 hr capsule
0.4 | ORAL | Status: AC
Start: 2014-02-20 — End: 2014-02-20
  Administered 2014-02-20: 15:00:00 0.4 mg via ORAL

## 2014-02-20 MED FILL — TAMSULOSIN 0.4 MG CAPSULE: 0.4 mg | ORAL | Qty: 1

## 2014-02-20 MED FILL — ONDANSETRON HCL (PF) 4 MG/2 ML INJECTION SOLUTION: 4 | INTRAMUSCULAR | Qty: 2

## 2014-02-20 MED FILL — KETOROLAC 30 MG/ML (1 ML) INJECTION SOLUTION: 30 mg/mL | INTRAMUSCULAR | Qty: 1

## 2014-02-20 MED FILL — HYDROMORPHONE (PF) 1 MG/ML INJECTION SYRINGE: 1 mg/mL | INTRAMUSCULAR | Qty: 1

## 2014-02-20 NOTE — ED Notes (Signed)
Pt. C/o LLQ abdominal pain since 0400.  Pt. States I had blood in my urine last Friday-Sunday, but then it went away.

## 2014-02-20 NOTE — ED Notes (Signed)
Handoff report given to Matt K, RN

## 2014-02-20 NOTE — ED Provider Notes (Signed)
[facility] EMERGENCY DEPARTMENT ENCOUNTER    History and Physical     Name: Richard Harrison  DOB: [birth year] 43 y.o.  MRN: [id number]  CSN: [id number]    HISTORY:     CHIEF COMPLAINT    Chief Complaint   Patient presents with   . Abdominal Pain     LLQ and with hematuria       HPI    Mano Situ is a 43 y.o. male who presents sudden onset of left lower quadrant pain at 4 AM.  Patient's pain radiates into his left testicle.  Patient reports his pain as a 10 out of 10 at onset and currently a 5 out of 10.  He did not try any medications for this.  Patient again reports that this is preceded by dark brown urine, which she understood to indicate that he had blood in his urine.  Reports a mild burning dysuria with urination over the last 3 days.  He also reports a strong odor with his urine.  He denies any objective fevers or chills.  Patient denies any trauma to this location.    REVIEW OF SYSTEMS    Constitutional:  He denies fevers or chills.  Patient denies any weight loss or weight gain   Eyes:  Denies change in visual acuity   HENT:  Denies nasal congestion or sore throat   Respiratory:  Denies cough or shortness of breath   Cardiovascular:  Denies chest pain or edema   GI:  Patient does report nausea but denies any vomiting.  Patient denies abdominal bloating or distention.  He denies any recent diarrhea, constipation, black or bloody bowel movements   GU:  Per history of present illness.   Musculoskeletal:  Denies back pain or joint pain   Integument:  Denies rash   Neurologic:  Denies headache, focal weakness or sensory changes   Endocrine:  Denies polyuria or polydipsia   Lymphatic:  Denies swollen glands   Psychiatric:  Denies depression or anxiety.     PAST MEDICAL HISTORY    Past Medical History   Diagnosis Date   . Bipolar affective disorder    . Hypertension        SURGICAL HISTORY    History reviewed. No pertinent past surgical history.    CURRENT MEDICATIONS    Prior to Admission medications       Medication Sig Start Date End Date Taking? Authorizing Provider   hydrOXYzine pamoate (VISTARIL) 50 MG capsule Take 1 capsule (50 mg total) by mouth nightly as needed (Insomnia). 02/07/13   Lenore Manner, MD       ALLERGIES    Penicillins    FAMILY HISTORY    History reviewed. No pertinent family history.    SOCIAL HISTORY    History   Substance Use Topics   . Smoking status: Never Smoker    . Smokeless tobacco: Never Used   . Alcohol Use: Yes     Other Social History Comments:       PHYSICAL EXAM:   VITAL SIGNS:   Initial Vitals   BP 02/20/14 0545 169/110 mmHg   Heart Rate 02/20/14 0545 101   Resp 02/20/14 0545 20   Temp 02/20/14 0545 36.8 ?C (98.2 ?F)   SpO2 02/20/14 0545 96 %     Constitutional:  Well developed, well nourished, no acute distress, non-toxic appearance.  Mildly obese.  Patient is resting comfortably in a supine position when I enter the room.   Eyes:  PERRL, conjunctiva normal   HENT:  Atraumatic, external ears normal, nose normal, oropharynx moist. Neck supple   Respiratory:  No respiratory distress, normal breath sounds   Cardiovascular:  Normal rate, normal rhythm, no murmurs, no gallops, no rubs   GI:  Moderately obese abdomen.  No guarding or rebound is noted.  No masses are palpated.   GU:  No inguinal hernias are palpated.  No masses or rashes noted within the groin.  Mild left CVA tenderness.    Musculoskeletal:  No edema   Integument:  Well hydrated.No ecchymosis is noted across the flanks or in the scrotum.  Patient is noted to have striae across the lower abdomen    Neurologic:  Alert & oriented x 3, no focal deficits.  GCS of 15.  Normal gait is noted.     DATA:     LABS    Results for orders placed during the hospital encounter of 02/20/14 (from the past 24 hour(s))   CBC WITH AUTO DIFFERENTIAL    Collection Time     02/20/14  6:11 AM       Result Value Ref Range    WBC 7.9  4.8 - 10.8 K/microL    RBC 5.53  4.70 - 6.10 M/microL    Hgb 15.8  12.0 - 18.0 gm/dL    HCT 04.5  40.9 - 81.1  %    MCV 85.9  81 - 99 fL    MCH 28.5  27 - 34 pg    MCHC 33.2  32 - 36 gm/dL    RDW 91.4  78.2 - 95.6 %    Platelet Count 237  150 - 400 K/microL    MPV 7.6  7.4 - 10.4 fL    Differential Type AUTO      Neutrophils % 48  35 - 70 %    Lymphocytes % 36  25 - 45 %    Monocytes % 14 (*) 0 - 12 %    Eosinophils % 1  0 - 7 %    Basophils % 1  0 - 2 %    Neutrophils, Absolute 3.8  1.6 - 7.3 K/microL    Lymphocytes, Absolute 2.9  1.1 - 4.3 K/microL    Monocytes, Absolute 1.1  0 - 1.2 K/microL    Eosinophils, Absolute 0.1  0 - 0.7 K/microL    Basophils, Absolute 0.1  0 - 0.2 K/microL   COMPREHENSIVE METABOLIC PANEL    Collection Time     02/20/14  6:11 AM       Result Value Ref Range    Sodium 137  135 - 143 mmol/L    Potassium 3.6  3.5 - 5.1 mmol/L    Chloride 104  98 - 111 mmol/L    CO2 - Carbon Dioxide 24.0  22 - 31 mmol/L    Glucose 116 (*) 80 - 99 mg/dL    BUN 9  8 - 21 mg/dL    Creatinine, Serum 2.13  0.64 - 1.27 mg/dL    Calcium 9.3  8.6 - 08.6 mg/dL    AST - Aspartate Aminotransferase 31  10 - 42 IU/L    ALT - Alanine Amino transferase 46  13 - 60 IU/L    Alkaline Phosphatase 67  37 - 107 IU/L    Bilirubin, Total 0.7  0.2 - 1.4 mg/dL    Protein, Total 7.6  6.1 - 7.9 gm/dL    Albumin 3.9  3.5 - 5.0 gm/dL  Globulin 3.7  2.2 - 3.7 gm/dL    Albumin/Globulin Ratio 1.1  >0.9    Anion Gap 9.0  3 - 11 mmol/L    GFR Estimate >60  >60 mL/min/1.73sq.m    GFR Additional Info        Value: For African Americans, multiply EGFR x 1.21.  See [email or web address]   LIPASE    Collection Time     02/20/14  6:11 AM       Result Value Ref Range    Lipase 29  15 - 50 U/L   URINALYSIS    Collection Time     02/20/14  7:47 AM       Result Value Ref Range    Urine Color YELLOW      Urine Character TURBID      Glucose, urine NEGATIVE  NEGATIVE mg/dl    Bilirubin, urine NEGATIVE  NEGATIVE    Ketones, urine NEGATIVE  NEGATIVE    Specific Gravity, urine 1.011  1.003 - 1.030    Blood, urine LARGE (*) NEGATIVE    pH, UA 5.0  5.0 - 8.5    Protein,  urine NEGATIVE  NEGATIVE    Urobilinogen, urine NEGATIVE  NEGATIVE EU/dL    Nitrite, urine NEGATIVE  NEGATIVE    Leukocyte Esterase, urine NEGATIVE  NEGATIVE    WBC, UA 1  0 - 9 /HPF    Squamous Epithelial, UA 0  0 - 5 /HPF    Bacteria, UA 1  0 - 92 /HPF    RBC, UA 11 TO 25  0 - 5 /HPF    Hyaline Casts, UA 2  0 - 5 /LPF    Urinalysis Microscopic MANUAL MICROSCOPIC         RADIOLOGY/PROCEDURES    CT renal colic without contrast   Final Result         CT RENAL COLIC WO CONTRAST   02/20/2014 06:06:01      PROVIDED HISTORY: left lower quadrant pain with hematuria       ADDITIONAL HISTORY: None.      COMPARISONS: None.      TECHNIQUE: Helical imaging of the abdomen and pelvis is obtained from    above the upper pole level of the kidneys inferiorly to the symphysis    pubis.  Two-dimensional sagittal and coronal reformations are also    generated.  No intravenous or oral contrast is    administered, and the upper-most aspect of the abdomen is not imaged.      FINDINGS: Evaluation of the abdominal solid organs is suboptimal without    intravenous contrast material.  Visualized portions of the liver show    heterogeneous hepatic steatosis with some areas of focal fatty sparing    about the gallbladder fossa.  The    gallbladder is contracted.  No calcified gallstones are seen.  The spleen,    pancreas and adrenal glands show normal noncontrasted appearance.      There is a 4 mm calculus in the upper portion of the left ureter, at the    level of the inferior L3 vertebral endplate.  There is mild left    pyelocaliectasis and mild upper left ureterectasis, with periureteral and    perinephric stranding.  No other    urolithiasis identified.  No right obstructive uropathy is seen.  Pelvic    phleboliths are incidentally noted.  Urinary bladder is unremarkable.      No dilated bowel loops.  A few colonic diverticula are  present.  The    appendix is normal.  No obvious focal bowel inflammation.  No abnormal    ascites or  pneumoperitoneum.      IMPRESSION:    1.  4 mm calculus in the superior portion of the left ureter with mild    associated hydronephrosis and superior hydroureter.   2.  Incidental hepatic steatosis with focal areas of fatty sparing about    the gallbladder fossa.       Preliminary interpretation of this exam was performed and communicated to    the ordering service by [facility] [employer] teleradiology service.      Abdominal CT Scan    Interpreted by: Radiologist    Findings: Dilatation of the right renal pelvis and proximal ureter secondary to ureteral stone within the UPJ of the left ureter.  Stone measures 4 mm x 4 mm x 3 mm.  Large amount of stool noted throughout the colon.      PLAN:     ED COURSE & MEDICAL DECISION MAKING    Pertinent Labs & Imaging studies reviewed. (See chart for details)   Differential diagnosis includes: Dehydration, renal colic, urolithiasis, diverticulitis, colitis, nonspecific abdominal pain.    Based on the CT results, patient will receive ketorolac, hydromorphone, and ondansetron.  Additionally, patient received a first dose of tamsulosin.  The patient will undergo pain management and formal CT results prior to disposition.  Patient's urinalysis did not show a concurrent urinary tract infection and therefore patient is not started on an antibiotic during the hospitalization or at time of discharge.    After administration of pain medications patient was reexamined and states he has 2 out of 10 pain.  Unfortunately, patient's oxygen saturation is rounded 91-92% with the hydromorphone and therefore he will not receive additional narcotics because of concerns of respiratory depression and hypoxemia or hypoventilation.    Patient has been observed in the emergency department for over an hour and a half and has had no return of pain.  He appears comfortable.  Patient be discharged home with appropriate pain medications and antiemetics.  He is encouraged to follow-up with [facility]  urology as patient may require lithotripsy because of the location of his stone.    The patient is discharged to home in stable and improved condition.  Patient's pain at time of discharge was a 0 out of 10.    FINAL IMPRESSION    1.  Renal colic  2.  Left ureterolithiasis  3.  Fatty liver disease      This document was created with the assistance of voice-to-text technology. Effort has been made to minimize transcription errors. Please allow for homonyms and other similar transcription errors.      Cato Mulligan, MD  02/20/14 [phone]

## 2014-02-20 NOTE — ED Notes (Signed)
PT resting comfortably. Awaiting results. Denies urge to void at this time. Fluids infusing.

## 2014-02-20 NOTE — Discharge Instructions (Signed)
Diet for Kidney Stones  Kidney stones are small, hard masses that form inside your kidneys. They are made up of salts and minerals and often form when high levels build up in the urine. The minerals can then start to build up, crystalize, and stick together to form stones. There are several different types of kidney stones. The following types of stones may be influenced by dietary factors:   ? Calcium Oxalate Stones. An oxalate is a salt found in certain foods. Within the body, calcium can combine with oxalates to form calcium oxalate stones, which can be excreted in the urine in high amounts. This is the most common type of kidney stone.  ? Calcium Phosphate Stones. These stones may occur when the pH of the urine becomes too high, or less acidic, from too much calcium being excreted in the urine. The pH is a measure of how acidic or basic a substance is.  ? Uric Acid Stones. This type of stone occurs when the pH of the urine becomes too low, or very acidic, because substances called purines build up in the urine. Purines are found in animal proteins. When the urine is highly concentrated with acid, uric acid kidney stones can form. ?  Other risk factors for kidney stones include genetics, environment, and being overweight. Your caregiver may ask you to follow specific diet guidelines based on the type of stone you have to lessen the chances of your body making more kidney stones.   GENERAL GUIDELINES FOR ALL TYPES OF STONES  ? Drink plenty of fluid. Drink 12 16 cups of fluid a day, drinking mainly water.?This is the most important thing you can do to prevent the formation of future kidney stones.  ? Maintain a healthy weight. Your caregiver or dietitian can help you determine what a healthy weight is for you. If you are overweight, weight loss may help prevent the formation of future kidney stones.  ? Eat a diet adequate in animal protein. Too much animal protein can contribute to the formation of stones. Your  dietitian can help you determine how much protein you should be eating. Avoid low carbohydrate, high protein diets.  ? Follow a balanced eating approach. The DASH diet, which stands for Dietary Approaches to Stop Hypertension, is an effective meal plan for reducing stone formation. This diet is high in fruits, vegetables, dairy, and whole grains and low in animal protein. Ask your caregiver or dietitian for information about the DASH diet.  ADDITIONAL DIET GUIDELINES FOR CALCIUM STONES  Avoid foods high in salt. This includes table salt, salt seasonings, MSG, soy sauce, cured and processed meats, salted crackers and snack foods, fast food, and canned soups and foods. Ask your caregiver or dietitian for information about reducing sodium in your diet or following the low sodium diet.   Ensure adequate calcium intake. Use the following table for calcium guidelines:  ? Men 26 years old and younger  1000 mg/day.  ? Men 95 years old and older  1500 mg/day.  ? Women 23 45 years old  1000 mg/day.  ? Women 50 years and older  1500 mg/day.  Your dietitian can help you determine if you are getting enough calcium in your diet. Foods that are high in calcium include dairy products, broccoli, cheese, yogurt, and pudding. If you need to take a calcium supplement, take it only in the form of calcium citrate.   Avoid foods high in oxalate. Be sure that any supplements you take do not  contain more than 500 mg of vitamin C. Vitamin C is converted into oxalate in the body. You do not need to avoid fruits and vegetables high in vitamin C.   ? Grains: High-fiber or bran cereal, whole-wheat bread, grits, barley, buckwheat, amaranth, pretzels, and fruitcake.  ? Vegetables: Dried beans, wax beans, dark leafy greens, eggplant, leeks, okra, parsley, rutabaga, tomato paste, watercress, zucchini, and escarole.  ? Fruit: Dried apricots, red currants, figs, kiwi, and rhubarb.  ? Meat and Meat Substitutes: Soybeans and foods made from soy  (soyburger, miso), dried beans, peanut butter.  ? Milk: Chocolate milk mixes and soymilk.  ? Fats and Oils: Nuts (peanuts, almonds, pecans, cashews, hazelnuts) and nut butters, sesame seeds, and tDahini paste.  ? Condiments/Miscellaneous: Chocolate, carob, marmalade, poppy seeds, instant iced tea, and juice from high-oxalate fruits. ??  Document Released: 04/04/2011 Document Revised: 06/08/2012 Document Reviewed: 05/24/2012  ExitCare? Patient Information ?2014 [place], [place].    Kidney Stones  Kidney stones (ureteral lithiasis) are deposits that form inside your kidneys. The intense pain is caused by the stone moving through the urinary tract. When the stone moves, the ureter goes into spasm around the stone. The stone is usually passed in the urine.   CAUSES   ? A disorder that makes certain neck glands produce too much parathyroid hormone (primary hyperparathyroidism).  ? A buildup of uric acid crystals.  ? Narrowing (stricture) of the ureter.  ? A kidney obstruction present at birth (congenital obstruction).  ? Previous surgery on the kidney or ureters.  ? Numerous kidney infections.  SYMPTOMS   ? Feeling sick to your stomach (nauseous).  ? Throwing up (vomiting).  ? Blood in the urine (hematuria).  ? Pain that usually spreads (radiates) to the groin.  ? Frequency or urgency of urination.  DIAGNOSIS   ? Taking a history and physical exam.  ? Blood or urine tests.  ? Computerized X-ray scan (CT scan).  ? Occasionally, an examination of the inside of the urinary bladder (cystoscopy) is performed.  TREATMENT   ? Observation.  ? Increasing your fluid intake.  ? Surgery may be needed if you have severe pain or persistent obstruction.  The size, location, and chemical composition are all important variables that will determine the proper choice of action for you. Talk to your caregiver to better understand your situation so that you will minimize the risk of injury to yourself and your kidney.   HOME CARE INSTRUCTIONS    ? Drink enough water and fluids to keep your urine clear or pale yellow.  ? Strain all urine through the provided strainer. Keep all particulate matter and stones for your caregiver to see. The stone causing the pain may be as small as a grain of salt. It is very important to use the strainer each and every time you pass your urine. The collection of your stone will allow your caregiver to analyze it and verify that a stone has actually passed.  ? Only take over-the-counter or prescription medicines for pain, discomfort, or fever as directed by your caregiver.  ? Make a follow-up appointment with your caregiver as directed.  ? Get follow-up X-rays if required. The absence of pain does not always mean that the stone has passed. It may have only stopped moving. If the urine remains completely obstructed, it can cause loss of kidney function or even complete destruction of the kidney. It is your responsibility to make sure X-rays and follow-ups are completed. Ultrasounds of the kidney  can show blockages and the status of the kidney. Ultrasounds are not associated with any radiation and can be performed easily in a matter of minutes.  SEEK IMMEDIATE MEDICAL CARE IF:   ? Pain cannot be controlled with the prescribed medicine.  ? You have a fever.  ? The severity or intensity of pain increases over 18 hours and is not relieved by pain medicine.  ? You develop a new onset of abdominal pain.  ? You feel faint or pass out.  MAKE SURE YOU:   ? Understand these instructions.  ? Will watch your condition.  ? Will get help right away if you are not doing well or get worse.  Document Released: 12/08/2005 Document Revised: 03/01/2012 Document Reviewed: 04/05/2010  ExitCare? Patient Information ?2014 [place], [place].    Lithotripsy for Kidney Stones  WHAT ARE KIDNEY STONES?  The kidneys filter blood for chemicals the body cannot use. These waste chemicals are eliminated in the urine. They are removed from the body. Under some  conditions, these chemicals may become concentrated. When this happens, they form crystals in the urine. When these crystals build up and stick together, stones may form. When these stones block the flow of urine through the urinary tract, they may cause severe pain. The urinary tract is very sensitive to blockage and stretching by the stone.  WHAT IS LITHOTRIPSY?  Lithotripsy is a treatment that can sometimes help eliminate kidney stones and pain faster. A form of lithotripsy, also known as ESWL (extracorporeal shock wave lithotripsy), is a nonsurgical procedure that helps your body rid itself of the kidney stone with a minimum amount of pain. EWSL is a method of crushing a kidney stone with shock waves. These shock waves pass through your body. They cause the kidney stones to crumble while still in the urinary tract. It is then easier for the smaller pieces of stone to pass in the urine.  Lithotripsy usually takes about an hour. It is done in a hospital, a lithotripsy center, or a mobile unit. It usually does not require an overnight stay. Your caregiver will instruct you on preparation for the procedure. Your caregiver will tell you what to expect afterward.  LET YOUR CAREGIVER KNOW ABOUT:  ? Allergies.  ? Medicines taken including herbs, eye drops, over the counter medicines (including aspirin, aleve, or motrin for treatment of inflammatory conditions) and creams.  ? Use of steroids (by mouth or creams).  ? Previous problems with anesthetics or novocaine.  ? Possibility of pregnancy, if this applies.  ? History of blood clots (thrombophlebitis).  ? History of bleeding or blood problems.  ? Previous surgery.  ? Other health problems.  RISKS AND COMPLICATIONS  Complications of lithotripsy are uncommon, but include the following:  ? Infection.  ? Bleeding of the kidney.  ? Bruising of the kidney or skin.  ? Obstruction of the ureter (the passageway from the kidney to the bladder).  ? Failure of the stone to  fragment (break apart).  PROCEDURE  A stent (flexible tube with holes) may be placed in your ureter. The ureter is the tube that transports the urine from the kidneys to the bladder. Your caregiver may place a stent before the procedure. This will help keep urine flowing from the kidney if the fragments of the stone block the ureter. You may receive an intravenous (IV) line to give you fluids and medicines. These medicines may help you relax or make you sleep. During the procedure, you will lie  comfortably on a fluid-filled cushion or in a warm-water bath. After an x-ray or ultrasound locates your stone, shock waves are aimed at the stone. If you are awake, you may feel a tapping sensation (feeling) as the shock waves pass through your body. If large stone particles remain after treatment, a second procedure may be necessary at a later date.  For comfort during the test:  ? Relax as much as possible.  ? Try to remain still as much as possible.  ? Try to follow instructions to speed up the test.  ? Let your caregiver know if you are uncomfortable, anxious, or in pain.  AFTER THE PROCEDURE   After surgery, you will be taken to the recovery area. A nurse will watch and check your progress. Once you're awake, stable, and taking fluids well, you will be allowed to go home as long as there are no problems. You may be prescribed antibiotics (medicines that kill germs) to help prevent infection. You may also be prescribed pain medicine if needed. In a week or two, your doctor may remove your stent, if you have one. Your caregiver will check to see whether or not stone particles remain.  PASSING THE STONE  It may take anywhere from a day to several weeks for the stone particles to leave your body. During this time, drink at least 8 to 12 eight ounce glasses of water every day. It is normal for your urine to be cloudy or slightly bloody for a few weeks following this procedure. You may even see small pieces of stone in your  urine. A slight fever and some pain are also normal. Your caregiver may ask you to strain your urine to collect some stone particles for chemical analysis. If you find particles while straining the urine, save them. Analysis tells you and the caregiver what the stone is made of. Knowing this may help prevent future stones.  PREVENTING FUTURE STONES  ? Drink about 8 to 12, eight-ounce glasses of water every day.  ? Follow the diet your caregiver recommends.  ? Take your prescribed medicine.  ? See your caregiver regularly for checkups.  SEEK IMMEDIATE MEDICAL CARE IF:  ? You develop an oral temperature above 102? F (38.9? C), or as your caregiver suggests.  ? Your pain is not relieved by medicine.  ? You develop nausea (feeling sick to your stomach) and vomiting.  ? You develop heavy bleeding.  ? You have difficulty urinating.  Document Released: 12/05/2000 Document Revised: 03/01/2012 Document Reviewed: 09/29/2008  ExitCare? Patient Information ?2014 [place], [place].

## 2014-03-24 ENCOUNTER — Observation Stay: Admit: 2014-03-24 | Payer: PRIVATE HEALTH INSURANCE

## 2014-03-24 ENCOUNTER — Inpatient Hospital Stay
Admission: EM | Admit: 2014-03-24 | Discharge: 2014-03-25 | Disposition: A | Discharge status: Home / Self Care | Payer: PRIVATE HEALTH INSURANCE | Admitting: MD

## 2014-03-24 ENCOUNTER — Emergency Department: Admit: 2014-03-24 | Payer: PRIVATE HEALTH INSURANCE

## 2014-03-24 DIAGNOSIS — N201 Calculus of ureter: Principal | ICD-10-CM

## 2014-03-24 LAB — BASIC METABOLIC PANEL
Anion Gap: 7 mmol/L (ref 3–11)
BUN: 12 mg/dL (ref 8–21)
CO2 - Carbon Dioxide: 24 mmol/L (ref 22–31)
Calcium: 9.1 mg/dL (ref 8.6–10.0)
Chloride: 107 mmol/L (ref 98–111)
Creatinine, Serum: 0.92 mg/dL (ref 0.64–1.27)
GFR Estimate: >60 mL/min/{1.73_m2} (ref >60)
Glucose: 107 mg/dL — ABNORMAL HIGH (ref 80–99)
Potassium: 3.7 mmol/L (ref 3.5–5.1)
Sodium: 138 mmol/L (ref 135–143)

## 2014-03-24 LAB — KIDNEY STONE ANALYSIS (MAYO)
1st Constituent: 60
2nd Constituent: 30
3rd Constituent: 10

## 2014-03-24 MED ORDER — propofol (DIPRIVAN) injection
10 | INTRAVENOUS | Status: DC | PRN
Start: 2014-03-24 — End: 2014-03-24
  Administered 2014-03-24 (×2): 10 mg/mL via INTRAVENOUS

## 2014-03-24 MED ORDER — ketorolac (TORADOL) injection 30 mg
30 | Freq: Once | INTRAMUSCULAR | Status: AC
Start: 2014-03-24 — End: 2014-03-24
  Administered 2014-03-24: 11:00:00 30 mg via INTRAVENOUS

## 2014-03-24 MED ORDER — sodium chloride 0.9 % bolus 1,000 mL
Freq: Once | INTRAVENOUS | Status: AC
Start: 2014-03-24 — End: 2014-03-24
  Administered 2014-03-24 (×2): via INTRAVENOUS

## 2014-03-24 MED ORDER — sodium chloride 0.9 % bolus 1,000 mL
Freq: Once | INTRAVENOUS | Status: DC
Start: 2014-03-24 — End: 2014-03-24

## 2014-03-24 MED ORDER — morphine injection 2 mg
2 | INTRAVENOUS | Status: DC | PRN
Start: 2014-03-24 — End: 2014-03-24
  Administered 2014-03-24: 23:00:00 2 mg via INTRAVENOUS

## 2014-03-24 MED ORDER — HYDROmorphone injection Syrg 0.2-0.4 mg
0.5 | INTRAMUSCULAR | Status: DC | PRN
Start: 2014-03-24 — End: 2014-03-24

## 2014-03-24 MED ORDER — ceFAZolin (ANCEF) IV syringe 1 g
1 | INTRAVENOUS | Status: DC | PRN
Start: 2014-03-24 — End: 2014-03-24
  Administered 2014-03-24: 20:00:00 1 gram/0 mL via INTRAVENOUS

## 2014-03-24 MED ORDER — fentaNYL (PF) (SUBLIMAZE) 50 mcg/mL injection
50 | INTRAMUSCULAR | Status: AC
Start: 2014-03-24 — End: ?

## 2014-03-24 MED ORDER — ketorolac (TORADOL) injection 15 mg
15 | Freq: Four times a day (QID) | INTRAMUSCULAR | Status: DC | PRN
Start: 2014-03-24 — End: 2014-03-24

## 2014-03-24 MED ORDER — rocuronium (ZEMURON) injection
10 | INTRAVENOUS | Status: DC | PRN
Start: 2014-03-24 — End: 2014-03-24
  Administered 2014-03-24: 20:00:00 10 mg/mL via INTRAVENOUS

## 2014-03-24 MED ORDER — fentaNYL (PF) (SUBLIMAZE) 50 mcg/mL injection
50 | INTRAMUSCULAR | Status: DC | PRN
Start: 2014-03-24 — End: 2014-03-24
  Administered 2014-03-24: 20:00:00 50 mcg/mL via INTRAVENOUS

## 2014-03-24 MED ORDER — sodium chloride 0.9 % bolus 1,000 mL
Freq: Once | INTRAVENOUS | Status: AC
Start: 2014-03-24 — End: 2014-03-24
  Administered 2014-03-24: 14:00:00 via INTRAVENOUS

## 2014-03-24 MED ORDER — fentaNYL (PF) (SUBLIMAZE) 50 mcg/mL injection 25-50 mcg
50 | INTRAMUSCULAR | Status: DC | PRN
Start: 2014-03-24 — End: 2014-03-24
  Administered 2014-03-24 (×2): 50 ug via INTRAVENOUS

## 2014-03-24 MED ORDER — sodium chloride 0.9% irrigation
0.9 | Status: DC | PRN
Start: 2014-03-24 — End: 2014-03-24
  Administered 2014-03-24: 19:00:00 0.9 % irrigation

## 2014-03-24 MED ORDER — HYDROmorphone (DILAUDID) syringe 1 mg
1 | Freq: Once | INTRAMUSCULAR | Status: AC
Start: 2014-03-24 — End: 2014-03-24
  Administered 2014-03-24: 11:00:00 1 mg via INTRAVENOUS

## 2014-03-24 MED ORDER — HYDROmorphone injection Syrg 0.4-0.8 mg
0.5 | INTRAMUSCULAR | Status: DC | PRN
Start: 2014-03-24 — End: 2014-03-24

## 2014-03-24 MED ORDER — meperidine (PF) (DEMEROL) syringe 12.5 mg
50 | INTRAMUSCULAR | Status: DC | PRN
Start: 2014-03-24 — End: 2014-03-24

## 2014-03-24 MED ORDER — Normosol-R infusion
INTRAVENOUS | Status: DC | PRN
Start: 2014-03-24 — End: 2014-03-24
  Administered 2014-03-24: 19:00:00 via INTRAVENOUS

## 2014-03-24 MED ORDER — ondansetron (ZOFRAN) injection 8 mg
4 | Freq: Once | INTRAMUSCULAR | Status: AC
Start: 2014-03-24 — End: 2014-03-24
  Administered 2014-03-24: 11:00:00 4 mg via INTRAVENOUS

## 2014-03-24 MED ORDER — midazolam (VERSED) injection
5 | INTRAMUSCULAR | Status: DC | PRN
Start: 2014-03-24 — End: 2014-03-24
  Administered 2014-03-24: 20:00:00 5 mg/mL via INTRAVENOUS

## 2014-03-24 MED ORDER — nalOXone (NARCAN) injection 0.08 mg
0.4 | INTRAMUSCULAR | Status: DC | PRN
Start: 2014-03-24 — End: 2014-03-24

## 2014-03-24 MED ORDER — fentaNYL (PF) (SUBLIMAZE) 50 mcg/mL injection 50-100 mcg
50 | INTRAMUSCULAR | Status: DC | PRN
Start: 2014-03-24 — End: 2014-03-24

## 2014-03-24 MED ORDER — ondansetron (ZOFRAN) injection 4 mg
4 | Freq: Four times a day (QID) | INTRAMUSCULAR | Status: DC | PRN
Start: 2014-03-24 — End: 2014-03-24

## 2014-03-24 MED ORDER — dextrose 5 % in lactated ringers infusion
INTRAVENOUS | Status: DC
Start: 2014-03-24 — End: 2014-03-24
  Administered 2014-03-24: 15:00:00 via INTRAVENOUS

## 2014-03-24 MED ORDER — diphenhydrAMINE (BENADRYL) injection 25 mg
50 | INTRAMUSCULAR | Status: DC | PRN
Start: 2014-03-24 — End: 2014-03-24

## 2014-03-24 MED FILL — KETOROLAC 30 MG/ML (1 ML) INJECTION SOLUTION: 30 mg/mL | INTRAMUSCULAR | Qty: 1

## 2014-03-24 MED FILL — FENTANYL (PF) 50 MCG/ML INJECTION SOLUTION: 50 ug/mL | INTRAMUSCULAR | Qty: 2

## 2014-03-24 MED FILL — MORPHINE 2 MG/ML INTRAVENOUS CARTRIDGE: 2 mg/mL | INTRAVENOUS | Qty: 1

## 2014-03-24 MED FILL — KETOROLAC 15 MG/ML INJECTION SOLUTION: 15 mg/mL | INTRAMUSCULAR | Qty: 1

## 2014-03-24 MED FILL — ONDANSETRON HCL (PF) 4 MG/2 ML INJECTION SOLUTION: 4 | INTRAMUSCULAR | Qty: 4

## 2014-03-24 MED FILL — HYDROMORPHONE (PF) 1 MG/ML INJECTION SYRINGE: 1 mg/mL | INTRAMUSCULAR | Qty: 1

## 2014-03-24 NOTE — ED Notes (Signed)
Pt states he will be able to find someone to drive him home since he may be receiving a dose of dilaudid.

## 2014-03-24 NOTE — ED Provider Notes (Signed)
SBAR from Dr. Cletis Media at 0600.    For full details please see Dr. Greer Ee note.  In brief patient is a male in his 40s who presented with approximately a month of left flank pain and was diagnosed with a 4 mm proximal left ureteral stone at that time. He's continued to have flank pain on the left side since then and, reportedly, had no physician follow-up. I assumed care of the patient as he was rating the results of a CT scan and the remainder of his blood work.    Results:  Results for orders placed during the hospital encounter of 03/24/14 (from the past 8 hour(s))   BASIC METABOLIC PANEL    Collection Time     03/24/14  4:36 AM       Result Value Ref Range    Sodium 138  135 - 143 mmol/L    Potassium 3.7  3.5 - 5.1 mmol/L    Chloride 107  98 - 111 mmol/L    CO2 - Carbon Dioxide 24.0  22 - 31 mmol/L    Glucose 107 (*) 80 - 99 mg/dL    BUN 12  8 - 21 mg/dL    Creatinine, Serum 1.61  0.64 - 1.27 mg/dL    Calcium 9.1  8.6 - 09.6 mg/dL    Anion Gap 7.0  3 - 11 mmol/L    GFR Estimate >60  >60 mL/min/1.73sq.m    GFR Additional Info        Value: For African Americans, multiply EGFR x 1.21.  See [email or web address]       CT renal colic abd pelvis without contrast    (Results Pending)    [facility] interpretation:  1. Moderate left obstructive uropathy, secondary to a 5.1 x 4.4 mm calculus within the internal bladder orifice of the distal left ureter  2. Moderately severe fatty infiltration of the liver with a small amount of focal fatty sparing around the gallbladder fossa.  3. Small area of subsegmental atelectasis are present in both lung bases.      Medical Decision Making:  Richard Harrison is a 43 y.o. male who presents to the emergency department for evaluation of ongoing left flank pain and urinary hesitancy and urgency. He was diagnosed with a left-sided kidney stone one month ago and initially felt that his symptoms resolved, but reoccurred last evening. He was initially evaluated by Dr. Cletis Media. See  his note for additional details.. The patient's workup has revealed evidence of a retained left kidney stone at the bladder orifice.  The patient was treated with 2 L of normal saline IV fluid, IV Zofran, Toradol, and Dilaudid. On reevaluation the patient is pain-free, but continues to have frequent urination and urinary hesitancy. He was given the option of admission for removal of the stone versus been discharged again on an alpha blocker. The patient prefers to be admitted I spoke with Dr. Marton Redwood, of the urology service, who will admit the patient for ongoing evaluation and management.  Prior to admission the patient's questions have been answered and he voices understanding and agreement with the plan.      Disposition:   Admission    Condition:  Stable    Impression:    SNOMED CT(R)   1. Kidney stone on left side  KIDNEY STONE   2. Flank pain  FLANK PAIN   3. Urinary hesitancy  DELAY WHEN STARTING TO PASS URINE  NOTE:  This dictation was produced using voice recognition software. Despite concurrent proofreading, please note that homonyms and other transcription errors may be present and that these errors may not truly reflect my intent.      [name], MD  03/24/14 [phone]

## 2014-03-24 NOTE — Anesthesia Post-Procedure Evaluation (Signed)
Patient Name: Richard Harrison  Procedures performed: Procedure(s):  CYSTOSCOPY; URETEROSCOPY; LASER HOLMIUM LITHOTRIPSY; STENT PLACEMENT    Last Vitals:   Filed Vitals:    03/24/14 1400   BP: 137/91   Pulse: 95   Temp:    Resp: 18   SpO2: 96%       Planned Anesthesia Type: general  Final Anesthesia Type: general  Patients Current Location: PACU  Level of Consciousness: awake  Post Procedure Pain:adequate analgesia  Respiratory Status: room air  Cardio Status: hemodynamically stable  Hydration: adequately hydrated  PONV? NO  Anesthetic Complications? No    The patient was able to participate in the post op evaluation    Comments:      Natasja Niday D. Roxan Hockey  2:12 PM

## 2014-03-24 NOTE — H&P (Signed)
ADMISSION DIAGNOSIS:  Distal left ureteral calculus.    HISTORY OF PRESENT ILLNESS:   This is a 43 year old gentleman who has had a  5 week history of left lower quadrant and left flank pain.  He had been  seen in the emergency room at [facility] one month ago and was found to have a  proximal 5 mm stone.  He had been discharged with followup at [facility]  Urology. He then presented to the emergency room today with worsened left  lower quadrant pain and left flank pain.  Repeat CT shows the stone is at  the UVJ. He elected to proceed with ureteroscopy and laser lithotripsy.  The patient has no prior history of urolithiasis.    ALLERGIES:   He has a stated allergy to PENICILLIN.    MEDICATIONS:  He says he takes medications for his bipolar disorder but  does not know what they are.    HABITS:  He does not use tobacco or alcohol.    PAST MEDICAL HISTORY:  Bipolar disorder.    SURGICAL HISTORY:   None.    SOCIAL HISTORY:   He is single, has one child.  He does not use tobacco or  alcohol.    REVIEW OF SYSTEMS:  No current cardiac, pulmonary, or gastrointestinal  complaints.    PHYSICAL EXAMINATION  GENERAL:  He is a healthy appearing male.  He is alert and oriented times  3.  He is having moderate flank discomfort.  VITAL SIGNS:  Temperature 36.6, pulse 78, respirations 17, blood pressure  142/70.    LABORATORY:  White count of 7.9, hemoglobin 15.8, hematocrit 47.5,  platelets 237, sodium 138, potassium 3.7, chloride 107, CO2 24, BUN 12,  creatinine 0.9, serum calcium 9.1.    CT was reviewed and showed the distal left stone.    IMPRESSION:  Distal left urolithiasis.    PLAN:  I discussed this with the patient in detail.  The patient  understands he could continue to try to pass this stone and it should have  a 90% chance of passing.  He could remain on tamsulosin and oral pain  medication.  Alternatively, he could proceed with ureteroscopy, laser  lithotripsy, and stent placement, and he elected to proceed  with  intervention.  PARQ was held with him.  Questions were encouraged and  answered.  He understands the risk include but are not limited to bleeding,  infection, 10% retreatment rate, risk of injury to the ureter, risk of  ureteral stricture, risk of ureteral avulsion.  He accepts this and wishes  to proceed.      Maudine Kluesner L. Daphine Deutscher, MD    ELM/taw  D: 03/24/2014 01:26 P   T: 03/25/2014 09:13 A   JOB: [id number]   DOCUMENT:  [id number]    cc: Margretta Ditty, MD     Izayiah Tibbitts L. Daphine Deutscher, MD

## 2014-03-24 NOTE — Discharge Instructions (Signed)
Remove stent on Monday.

## 2014-03-24 NOTE — OR Nursing (Signed)
Pt resting quietly. Uneventful pacu stay. NAD. Pain under control. Denies nausea.

## 2014-03-24 NOTE — Anesthesia Pre-Procedure Evaluation (Signed)
Anesthesia Evaluation     Patient summary reviewed    Airway   Mallampati: I  TM distance: 6.5 - 8 cm  Neck ROM: full  Dental - normal exam     Pulmonary - negative ROS and normal exam    breath sounds clear to auscultation  Cardiovascular - negative ROS and normal exam  (-) hypertension    Rhythm: regular  Rate: normal    Neuro/Psych - negative ROS     GI/Hepatic/Renal - negative ROS     Endo/Other - negative ROS    Musculoskeletal - negative ROS                   Anesthesia Plan    ASA 1     general     intravenous induction   Anesthetic Plan, Alternatives, Risks and Questions discussed with patient.

## 2014-03-24 NOTE — Op Note (Signed)
DATE OF PROCEDURE:  March 24, 2014    PREOPERATIVE DIAGNOSIS:  Distal left ureteral calculus.    POSTOPERATIVE DIAGNOSIS:  Distal left ureteral calculus.    PROCEDURE:  Left ureteroscopy, laser lithotripsy, stent placement.    SURGEON:   Marton Redwood, MD    ANESTHESIA:  __________, general.    INDICATIONS:  This is a gentleman with a distal left stone, ureteroscopy  and laser lithotripsy and stent placement.    PROCEDURE:  The patient brought back to the operating room, appropriately  identified, and placed on the table in the supine position, placed under  general anesthesia. He was placed in dorsal lithotomy.  He received  preoperative antibiotics, SCDs, thigh high TED hose.  He underwent  cystoscopy.  He has a stone orifice on the left.  The bladder mucosa  appeared normal.  The orifice appears normal.  His anterior and posterior  urethra appear normal. He has early bilobar hypertrophy.  Left ureteral  orifice was cannulated, a guidewire was placed. We dilated the left  ureteral orifice passed the Stryker ureteroscope.  The stone was  encountered and fragmented with a holmium laser using a 200 micron fiber.  We then used stone basket and removed the stone fragments.  At this point,  then we placed a 6-French variable length stent.  Position confirmed by  fluoroscopy.  He was then awakened from his anesthesia and transported to  recovery in stable condition.            Catlyn Shipton L. Daphine Deutscher, MD    ELM/taw  D: 03/24/2014 01:30 P   T: 03/25/2014 09:31 A   JOB: [id number]   DOCUMENT:  [id number]    cc: Margretta Ditty, MD     Mickaela Starlin L. Daphine Deutscher, MD

## 2014-03-24 NOTE — ED Provider Notes (Signed)
Chief Complaint   Patient presents with   . Flank Pain       HPI:  This is a 43 y.o. male who presents with left flank pain.    This patient was seen here a month ago with left flank pain and diagnosed with a 4 mm proximal left ureteral stone.  The patient has continued to have flank pain on the left since then but has not had any physician follow-up.  The pain has migrated down now to the left lower abdomen and the patient's having intense urgency and frequency although no dysuria.  He denies fevers or chills nausea or vomiting.    ROS:     HEENT: negative  RESPIRATORY: negative  CARDIAC: negative  GI: As above otherwise negative  GU: As above otherwise negative  MUSCULOSKELETAL: negative  NEURO: negative  PSYCHIATRIC: negative  INTEGUMENTARY: negative  GENERAL: negative    PMH:   Past Medical History   Diagnosis Date   . Bipolar affective disorder    . Hypertension    . Kidney stone        PSH:   Past Surgical History   Procedure Laterality Date   . Cystoscopy; ureteroscopy; laser holmium lithotripsy; stent placement 03/24/2014     Performed by Daphine Deutscher, ERIC at [facility] OR.       Medications:   Discharge Medication List as of 03/24/2014  3:47 PM      CONTINUE these medications which have NOT CHANGED    Details   hydrOXYzine pamoate (VISTARIL) 50 MG capsule Take 1 capsule (50 mg total) by mouth nightly as needed (Insomnia)., Starting 02/07/2013, Until Discontinued, Print             Allergies   Allergen Reactions   . Penicillins        BP 163/98   Pulse 80   Temp(Src) 36.6 ?C (97.9 ?F) (Oral)   Resp 18   Ht 175.3 cm (69)   Wt 112.2 kg (247 lb 5.7 oz)   BMI 36.51 kg/m2     SpO2 96%     General:  Well nourished, well hydrated in mild to moderate distress.  Integumentary: Skin pink, warm, dry, no rashes.  Neuro:  Awake, alert, moves all four extremities well, balance normal, GCS 15.  HEENT: No scleral icterus, no conjunctival injection, PERRL, EOM's intact.  Respiratory:Normal respiratory pattern, clear, equal breath  sounds.  Cardiac: Regular rhythm, normal rate, no murmurs.  Gastrointestinal: Soft, moderately tender left lower abdomen, no CVA tenderness to percussion, no guarding, no rebound, no masses felt, liver and spleen not palpable.  Musculoskeletal: No painful or swollen joints.  Psychiatric: Cooperative, calm, not responding to internal stimuli.    Medical Decision Making: This patient probably has a left ureteral stone that is still not passed although it is probably much more distal today.  UTI, diverticulitis, gastroenteritis, abdominal wall strain, hernia, mesenteric adenitis, and other problems are considered.    Medications   ondansetron (ZOFRAN) injection 8 mg (8 mg Intravenous Given 03/24/14 0426)   ketorolac (TORADOL) injection 30 mg (30 mg Intravenous Given 03/24/14 0427)   HYDROmorphone (DILAUDID) syringe 1 mg (1 mg Intravenous Given 03/24/14 0429)   sodium chloride 0.9 % bolus 1,000 mL (0 mLs Intravenous Stopped 03/24/14 0639)   sodium chloride 0.9 % bolus 1,000 mL (1,000 mLs Intravenous New Bag 03/24/14 0704)       Labs Reviewed   BASIC METABOLIC PANEL - Abnormal; Notable for the following:     Glucose  107 (*)     All other components within normal limits   KIDNEY STONE ANALYSIS       Fluoroscopy retrograde surgery   Final Result         [facility] RETROGRADE SURGERY   03/24/2014 11:24:19      PROVIDED HISTORY: Cystoscopy; Ureteroscopy; Laser Holmium Lithotripsy.        ADDITIONAL HISTORY: None.      COMPARISON: None.      FINDINGS: Single live procedure fluoroscopic image shows a left ureteral    stent.      IMPRESSION: See findings above.       CT renal colic abd pelvis without contrast   Final Result         CT RENAL COLIC ABDOMEN PELVIS WO CONTRAST   03/24/2014 04:53:30      PROVIDED HISTORY: Left flank pain       ADDITIONAL HISTORY: None.      COMPARISONS: 02/20/2014      TECHNIQUE: Helical imaging of the abdomen and pelvis is obtained from    above the upper pole level of the kidneys inferiorly to the symphysis    pubis.   Two-dimensional sagittal and coronal reformations are also    generated.  No intravenous or oral contrast is    administered, and the upper-most aspect of the abdomen is not imaged.      FINDINGS:       Interval migration of left proximal ureteral/ UPJ stone now at the UVJ.    Slight interval increase in the mild left hydronephrosis and hydroureter.    Faint left perinephric stranding is likely reactive although infection    cannot be excluded.  No right renal,    ureteral, or bladder calculi are identified.      Linear opacities the lung bases are compatible with atelectasis.  No    pericardial or pleural effusion is seen.  Diffuse hypoattenuation hepatic    parenchyma is compatible hepatic steatosis.  Focal sparing gallbladder    fossa is again seen.      The gallbladder, pancreas, spleen, adrenal glands, are normal.  The    abdominal aorta is normal in course and caliber without evidence of    aneurysm.  The bowel is decompressed without evidence of obstruction.     Scattered distal colonic diverticula are noted    without evidence of diverticulitis.  The appendix well visualized and    normal.  No intra-abdominal free air, free fluid, adenopathy or mass    lesion is seen.      The prostate seminal vesicles are unremarkable.  No pelvic sidewall or    inguinal adenopathy or mass lesion is seen.  There is no pelvic free    fluid.      No acute fracture or destructive osseous lesion is seen.      IMPRESSION:       Interval migration of the 4 mm left UPJ stone now at the UPJ with slight    interval increase in mild left-sided hydronephrosis hydroureter.      Hepatic steatosis, as before.      Bibasilar atelectasis.            SNOMED CT(R)   1. Kidney stone on left side  KIDNEY STONE   2. Flank pain  FLANK PAIN   3. Urinary hesitancy  DELAY WHEN STARTING TO PASS URINE       Noralyn Pick, MD  03/28/2014      This dictation was  produced using voice recognition software. Despite concurrent proofreading, please note  that transcription errors are common and may not reflect the true intent of the examiner.              Noralyn Pick, MD  03/28/14 2012

## 2014-03-24 NOTE — Discharge Instructions - Pharmacy (Signed)
Norco 5/325 mg by mouth 1 to 2 tabs every 4-6 hours as needed for pain

## 2014-03-24 NOTE — ED Notes (Signed)
Assumed care of pt, pt pain free, unable to urinate, MD aware, IV fluids infusing w/o without problems. Pt placed on O2 monitor.

## 2014-03-24 NOTE — Discharge Instructions (Signed)

## 2014-03-24 NOTE — ED Notes (Signed)
Verbal orders taken by Dr. Daphine Deutscher. Ready to admit.

## 2014-03-24 NOTE — Discharge Instructions (Signed)
No strenuous activity. No lifting over 15 pounds. You may shower. No driving until pain free and off pain medication.

## 2014-03-24 NOTE — ED Notes (Signed)
Pt states he is attempting to provide urine specimen but is unable to start stream.

## 2014-03-24 NOTE — Brief Op Note (Signed)
Brief Operative Note    Procedure(s):  CYSTOSCOPY; URETEROSCOPY; LASER HOLMIUM LITHOTRIPSY; STENT PLACEMENT left     Preoperative Diagnoses:   Calculus [592.9]    Postoperative Diagnoses:  Post-Op Diagnosis Codes:     * Calculus [592.9]     Surgeons: Surgeon(s) and Role:     * Marton Redwood, MD - Primary    Assist: * No surgical staff entered *     Anesthesia Provider: Anesthesiologist: Novella Rob. Roxan Hockey, MD    Anesthesia Type: General    Height & Weight:175.3 cm (69) & 112.2 kg (247 lb 5.7 oz)     Specimens:   ID Type Source Tests Collected by Time Destination   1 :   ureteral stone for analysis,   LEFT Other Stone KIDNEY STONE ANALYSIS Marton Redwood, MD 03/24/2014 1315               Implants:  Implant Name Type Inv. Item Serial No. Manufacturer Lot No. LRB No. Used Action   STENT URE 6FR 26CM PERCUFLEX PLUS - [id number]   [id number]   [employer] [id number] N/A 1 Implanted       Antiobiotics (admin):   Recent Abx Admin     No antibiotic orders with administrations found.                Incision Time:   Anes Incision Time    Date Time Event    03/24/14 1246 Incision           Timeout Completed:  Timeouts    Elmer Ramp, RN at [facility] Mar 24, 2014 1240    Timeout Details    Timeout type  Pre-incision                Elmer Ramp, RN at [facility] Mar 24, 2014 1318    Timeout Details    Timeout type  Sign-out                       I&O:   Intake Output for Last 24 Hours      03/24/14 0700 - 03/25/14 0659      [id number] Total       Intake    Total Intake -- -- --       Output    Urine  300  -- 300    Urine 300 -- 300    Total Output 300 -- 300       Net I/O     -300 -- -300          Urethral Catheter:           Findings: distal left stone    Complications: none     Disposition: PACU - hemodynamically stable.    Marton Redwood

## 2014-04-04 ENCOUNTER — Ambulatory Visit: Payer: PRIVATE HEALTH INSURANCE

## 2014-04-11 ENCOUNTER — Inpatient Hospital Stay: Admit: 2014-04-11 | Payer: PRIVATE HEALTH INSURANCE | Attending: MD

## 2014-04-11 DIAGNOSIS — N200 Calculus of kidney: Secondary | ICD-10-CM

## 2014-10-12 NOTE — Treatment Summary (Signed)
PT Daily Note    Patient name: Richard Harrison Visits from [facility]:  / /     Date of Birth: 1971-06-16 Date: 12/03/2012   Start Time:   Stop Time:          No Data Recorded     No Data Recorded    Precautions:   No Data Recorded     SUBJECTIVE:  Patient cancelled appointment  Pain Level:      OBJECTIVE:   N/A    Treatment:  N/A    ASSESSMENT:   N/A   Patient continues to demonstrate limitations which require skilled PT Intervention to achieve the following goals:     PT Treatment Goals  No Data Recorded    No Data Recorded No Data Recorded    No Data Recorded No Data Recorded    No Data Recorded No Data Recorded    No Data Recorded No Data Recorded    No Data Recorded  No Data Recorded   No Data Recorded     No Data Recorded No Data Recorded    No Data Recorded  No Data Recorded    No Data Recorded  No Data Recorded    No Data Recorded  No Data Recorded    No Data Recorded  No Data Recorded      Patient/Caregiver goals reviewed and integrated with rehab treatment plan.    PLAN:   N/A   Continue PT per established plan of care. This note to serve as a Discharge Summary if Richard Harrison is discharged from therapy services.    Ilisha Blust C. Kada Friesen, PT  ( )

## 2014-10-17 ENCOUNTER — Inpatient Hospital Stay: Admit: 2014-10-17 | Payer: PRIVATE HEALTH INSURANCE | Attending: MD

## 2014-10-17 DIAGNOSIS — K5900 Constipation, unspecified: Secondary | ICD-10-CM

## 2015-09-12 DIAGNOSIS — R45850 Homicidal ideations: Secondary | ICD-10-CM

## 2015-09-12 NOTE — Discharge Instructions (Signed)
No-harm Safety Contract  °A no-harm safety contract is a written or verbal agreement between you and a mental health professional to promote safety. It contains specific actions and promises you agree to. The agreement also includes instructions from the therapist or doctor. The instructions will help prevent you from harming yourself or harming others. Harm can be as mild as pinching yourself, but can increase in intensity to actions like burning or cutting yourself. The extreme level of self-harm would be committing suicide. No-harm safety contracts are also sometimes referred to as a no-suicide contract, suicide prevention contract, no-harm agreements or decisions, or a safety contract.  °REASONS FOR NO-HARM SAFETY CONTRACTS °Safety contracts are just one part of an overall treatment plan to help keep you safe and free of harm. A safety contract may help to relieve anxiety, restore a sense of control, state clearly the alternatives to harm or suicide, and give you and your therapist or doctor a gauge for how you are doing in between visits. °Many factors impact the decision to use a no-harm safety contract and its effectiveness. A proper overall treatment plan and evaluation and good patient understanding are the keys to good outcomes. °CONTRACT ELEMENTS  °A contract can range from simple to complex. They include all or some of the following:  °Action statements. These are statements you agree to do or not do. °Example: If I feel my life is becoming too difficult, I agree to do the following so there is no harm to myself or others: °· Talk with family or friends. °· Rid myself of all things that I could use to harm myself. °· Do an activity I enjoy or have enjoyed in the recent past. °Coping strategies. These are ways to think and feel that decrease stress, such as: °· Use of affirmations or positive statements about self. °· Good self-care, including improved grooming, and healthy eating, and healthy sleeping  patterns. °· Increase physical exercise. °· Increase social involvement. °· Focus on positive aspects of life. °Crisis management. This would include what to do if there was trouble following the contract or an urge to harm. This might include notifying family or your therapist of suicidal thoughts. Be open and honest about suicidal urges. To prevent a crisis, do the following: °· List reasons to reach out for support. °· Keep contact numbers and available hours handy. °Treatment goals. These are goals would include no suicidal thoughts, improved mood, and feelings of hopefulness. °Listed responsibilities of different people involved in care. This could include family members. A family member may agree to remove firearms or other lethal weapons/substances from your ease of access. °A timeline. A timeline can be in place from one therapy session to the next session. °HOME CARE INSTRUCTIONS  °· Follow your no-harm safety contract. °· Contact your therapist and/or doctor if you have any questions or concerns. °MAKE SURE YOU:  °· Understand these instructions. °· Will watch your condition. Noticing any mood changes or suicidal urges. °· Will get help right away if you are not doing well or get worse. °Document Released: 05/28/2010 Document Revised: 03/01/2012 Document Reviewed: 05/28/2010 °ExitCare® Patient Information ©2015 [employer]. This information is not intended to replace advice given to you by your health care provider. Make sure you discuss any questions you have with your health care provider. ° °

## 2015-09-12 NOTE — ED Notes (Signed)
Offered food and fluids but pt declined saying If you gave me food I'd just save it and give it to a homeless man later.

## 2015-09-12 NOTE — ED Provider Notes (Signed)
[facility] VISIT NOTE     History and Physical     Name: Richard Harrison  DOB: 1971/08/13 44 y.o.  MRN: [id number]  CSN: [id number]    HISTORY:     CHIEF COMPLAINT    My family made me come    HPI    Richard Harrison is a 44 y.o. male brought in by police. Patient states that he has a history of PTSD and reported bipolar disorder for which he is currently taking his medications. Patient was brought in after family called police because he had expressed thoughts of wanting to kill. The person that he wanted to kill was tried and completed of the murder of one of his family members. Patient states that he's had thoughts of hurting this patient for the last 10 years. He denies any active plan denies any access to weapons. He denies any suicidal ideations auditory or visual hallucinations. He denies drug or tobacco abuse. He does state that he had a single Marguerita today at around 2 PM. Denies any other alcohol.    REVIEW OF SYSTEMS:  Please see HPI.  All other systems were reviewed and are negative.    PAST MEDICAL HISTORY    Past Medical History   Diagnosis Date   . Bipolar affective disorder    . Hypertension    . Kidney stone        SURGICAL HISTORY    Past Surgical History   Procedure Laterality Date   . Procedure N/A 03/24/2014     Procedure: CYSTOSCOPY; URETEROSCOPY; LASER HOLMIUM LITHOTRIPSY; STENT PLACEMENT;  Surgeon: Marton Redwood, MD;  Location: [facility] OR;  Service: Urology;  Laterality: N/A;        CURRENT MEDICATIONS    Home Medications     Last Medication Reconciliation Action:  Complete Everardo Pacific, RN 09/12/2015  9:10 PM              Last Dose     hydrOXYzine pamoate (VISTARIL) 50 MG capsule      Take 1 capsule (50 mg total) by mouth nightly as needed (Insomnia).     lamoTRIgine (LAMICTAL) 100 MG tablet      Take 100 mg by mouth nightly.     QUEtiapine (SEROQUEL) 100 MG tablet      Take 100 mg by mouth nightly. 50mg  QAM  150mg  QHS          ALLERGIES     Penicillins    SOCIAL HISTORY    Social History   Substance Use Topics   . Smoking status: Never Smoker    . Smokeless tobacco: Never Used   . Alcohol Use: Yes         PHYSICAL EXAM:   INITIAL VITAL SIGNS:      Initial Vitals   BP 09/12/15 1952 149/90 mmHg   Heart Rate 09/12/15 1952 124   Resp 09/12/15 1952 16   Temp 09/12/15 1952 36.5 ?C (97.7 ?F)   SpO2 09/12/15 1952 95 %     General:  Alert and oriented  HEENT:  Normocephalic, Atraumatic, PERRL.   Neck: Supple.   Chest:  Normal respiratory effort.    Extremities: Warm, well perfused.    Skin:  No rash on exposed skin surfaces   Neurologic: Moving all extremities spontaneously, ambulating without difficulty.   Psychiatric: General appearance is well-kept but casual.  Mood is congruent with affect.  Affect angry.  Does does not appear to be responding to  internal stim.  No suicidal ideation.  Yes without plan homicidal ideation.  Yes appear to be thinking clearly.       RESULTS :     LABS    Results for orders placed or performed during the hospital encounter of 09/12/15 (from the past 24 hour(s))   Urinalysis with microscopic -STAT    Collection Time: 09/12/15  8:37 PM   Result Value Ref Range    Urine Color YELLOW     Urine Character CLEAR     Glucose, UA NEGATIVE NEGATIVE mg/dl    Bilirubin, urine NEGATIVE NEGATIVE    Ketones, urine NEGATIVE NEGATIVE    Specific Gravity, urine 1.018 1.002 - 1.035    Blood, urine NEGATIVE NEGATIVE    Protein, urine NEGATIVE NEGATIVE    pH, UA 6.0 5.0 - 9.0    Urobilinogen, urine NEGATIVE NEGATIVE EU/dL    Nitrite, urine NEGATIVE NEGATIVE    Leukocyte Esterase, urine NEGATIVE NEGATIVE    ASCORBIC ACID NEGATIVE     WBC, UA <1 0 - 3 /HPF    RBC, UA <1 0 - 5 /HPF    Bacteria, UA NONE SEEN NONE SEEN /HPF    Mucus, UA 1+     SQUAMOUS EPITHELIAL, UA 0 0 - 6 /HPF    Granular Casts, UA 18 (H) 0 - 1 /LPF    Hyaline Casts, UA 0 0 - 1 /LPF   Drug Screen 8, urine ([facility]) -STAT    Collection Time: 09/12/15  8:37 PM   Result Value Ref  Range    pH Drug Screen 6.0 5.0 - 9.0    Creatinine Drug Screen 192 >20 mg/dL    Amphetamine NEGATIVE     Barbiturates NEGATIVE     Benzodiazepines NEGATIVE     Cocaine NEGATIVE     Marijuana NEGATIVE     Methadone Screen, urine NEGATIVE     Opiates NEGATIVE     Oxycodone NEGATIVE        RADIOLOGY/PROCEDURES    No orders to display         ED COURSE & MEDICAL DECISION MAKING    Data results reviewed. Nursing note reviewed.   Patient is a 44 year old male who presents with homicidal ideations. Patient has had these thoughts for the last 10 years. There is no obvious significant stressor or change. Patient does not have a specific plan to hurt this person. He wishes to hurt him because he feels that this person murdered his sister many years ago. The patient does not appear to be actively real spine to internal stimuli. He contracts for safety and states that he will not hurt this person. The psychiatric RN contacted the police who will notify the person who the patient wishes to harm. At this.time do not feel the patient has any active plan or reported access to weapons. He does not appear to be responding to internal stimuli. He does contract for safety    DISPOSITION:  As above.    CLINICAL IMPRESSION:       SNOMED CT(R)   1. Homicidal ideations  FINDING OF THOUGHT CONTENT         **This document was created with the assistance of voice-to-text technology. Effort has been made to minimize transcription errors. Please allow for homonyms and other similar transcription errors, such as she in place of he and vice versa.Bud Face, MD  09/12/15 2226

## 2015-09-12 NOTE — ED Notes (Signed)
Pt requested to be d/c'd, declined offer of a voluntary admission to [facility].  Pt layed self on floor, asked for assistance up, security and staff assisted pt to standing.  Pt groaned stating he pulled his hamstring on his right leg.  Pt began yelling he had been injured and we were refusing him medical care, pt was asked several times by multiple staff to return to his room so we could get the doctor to assess him.  Pt refused, demanded to leave, pt was told that he had been d/c'd so he was able to leave if he chose to, pt was then again offered medical assistance but continue to yell that we were refusing him medical care.  Pt was escorted to triage by staff and security where he planned to check in for a medical assessment.

## 2015-09-12 NOTE — ED Notes (Signed)
Patient is security checked and spoke with about safety.  Patient states that he is safe without hand-cuffs and will not hurt any of use.  He states that he just wants to speak with the nascar drivers.  Patient has sauce packets removed from his pants pocket and cuffs removed without incident.

## 2015-09-12 NOTE — ED Notes (Signed)
MD Kayren Eaves & RN Katie @bedside . Pt used bathroom & UA sent to labs. Pt given water.

## 2015-09-12 NOTE — ED Notes (Signed)
Fiance @ bedside.

## 2015-09-12 NOTE — ED Notes (Signed)
Security reported that pt went to lobby and was yelling loudly, he was escorted outside he saw the camera then again layed himself on the ground again and began moaning, I'm injured.  Security told pt that they were going to call the police for c/o disorderly conduct, he then got up and began walking down the road.  Fiance reported she will attempt to file a 2 party petition for pt to be brought in.

## 2015-09-12 NOTE — ED Notes (Signed)
Registration @ bedside

## 2015-09-12 NOTE — ED Notes (Signed)
RN Katie @bedside

## 2015-09-12 NOTE — ED Notes (Signed)
PCU report from RN to ED Dr.    Coralie Keens in by: Police    Type of hold: voluntary    Chief complaint and why did they come into the ED: Homicidal towards Laure Kidney who killed my sister, contracts for safety 99.9%.  Pressured, tangential speech, grandiose, giving belongings and money away.    Mental Health diagnosis: Reports hx of PTSD, denies dx of bi-polar    Brief history of self harm/suicide attempts/harming others? Denies    Medical diagnosis: Arthritis, chronic pain in left knee    Collateral information from family/friends: Awaiting girlfriend    Disposition idea for patient: need to obtain additional info from family

## 2015-09-13 ENCOUNTER — Inpatient Hospital Stay
Admit: 2015-09-13 | Discharge: 2015-09-13 | Disposition: A | Discharge status: Home / Self Care | Payer: MEDICARE | Attending: MD

## 2015-09-13 LAB — URINALYSIS WITH MICROSCOPIC
ASCORBIC ACID: NEGATIVE
Bacteria, UA: NONE SEEN /HPF
Bilirubin, urine: NEGATIVE
Blood, urine: NEGATIVE
Glucose, UA: NEGATIVE mg/dl
Granular Casts, UA: 18 /LPF — ABNORMAL HIGH (ref 0–1)
Hyaline Casts, UA: 0 /LPF (ref 0–1)
Ketones, urine: NEGATIVE
Leukocyte Esterase, urine: NEGATIVE
Nitrite, urine: NEGATIVE
Protein, urine: NEGATIVE
RBC, UA: <1 /HPF (ref 0–5)
SQUAMOUS EPITHELIAL, UA: 0 /HPF (ref 0–6)
Specific Gravity, urine: 1.018 (ref 1.002–1.035)
Urobilinogen, urine: NEGATIVE EU/dL
WBC, UA: <1 /HPF (ref 0–3)
pH, UA: 6 (ref 5.0–9.0)

## 2015-09-13 LAB — URINE DRUG SCREEN 8
Amphetamine: NEGATIVE
Barbiturates: NEGATIVE
Benzodiazepines: NEGATIVE
Cocaine: NEGATIVE
Creatinine Drug Screen: 192 mg/dL (ref >20)
Marijuana: NEGATIVE
Methadone Screen, urine: NEGATIVE
Opiates: NEGATIVE
Oxycodone: NEGATIVE
pH Drug Screen: 6 (ref 5.0–9.0)

## 2015-09-15 ENCOUNTER — Emergency Department: Admit: 2015-09-15 | Discharge: 2015-09-15 | Payer: Auto Insurance (includes no fault)

## 2015-09-15 ENCOUNTER — Inpatient Hospital Stay
Admit: 2015-09-15 | Discharge: 2015-09-15 | Disposition: A | Discharge status: Home / Self Care | Payer: Auto Insurance (includes no fault) | Attending: MD

## 2015-09-15 DIAGNOSIS — S29012A Strain of muscle and tendon of back wall of thorax, initial encounter: Secondary | ICD-10-CM

## 2015-09-15 MED ORDER — ibuprofen (ADVIL,MOTRIN) tablet 600 mg
600 | Freq: Once | ORAL | Status: AC
Start: 2015-09-15 — End: 2015-09-15
  Administered 2015-09-15: 13:00:00 600 mg via ORAL

## 2015-09-15 MED FILL — IBUPROFEN 600 MG TABLET: 600 mg | ORAL | Qty: 1

## 2015-09-15 NOTE — ED Notes (Signed)
velcro wrist splint to left wrist - distal cms remains intact after placement. Pt states it improves his comfort.     Knee immobilizer to left knee - distal cms remains intact after splint placement.    Pt verbalized and demonstrated crutch use.
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
## 2015-09-15 NOTE — Discharge Instructions (Signed)
Rest next few days.    Frequent ice or heat to areas of discomfort.      Ibuprofen 600 mg every 6 hours and/or acetaminophen 650 mg every 4 hours as needed for discomfort.

## 2015-09-16 DIAGNOSIS — F302 Manic episode, severe with psychotic symptoms: Secondary | ICD-10-CM

## 2015-09-16 NOTE — ED Notes (Signed)
Psych eval

## 2015-09-16 NOTE — ED Notes (Signed)
Security check is done ,patients belongings are in the cabinet under the fax on the yellow side.

## 2015-09-16 NOTE — ED Notes (Signed)
Pt given socks  

## 2015-09-16 NOTE — ED Notes (Signed)
Pt has multiple requests, wants to take a shower, wants clothes, wants socks, wants food, wants the door shut

## 2015-09-16 NOTE — ED Notes (Signed)
Pt urinated in the corner, on the floor as well as in a specimen up   Urine sent to lab

## 2015-09-16 NOTE — ED Notes (Signed)
Pt brought in by [employer].  Pt is bipolar and currently in manic state, PD had been called earlier in the day for an argument with fiance.  Called later in the day by family for suicidal and homicidal statements.  Family told PD that pt is struggling with death of his sister.  Fiance has possession of medications and gives them to him daily, states that he is currently taking his medications daily.
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
## 2015-09-17 ENCOUNTER — Inpatient Hospital Stay
Admission: EM | Admit: 2015-09-17 | Discharge: 2015-09-18 | Disposition: A | Discharge status: Other Acute Inpatient Hospital | Payer: MEDICARE | Admitting: Psychiatry

## 2015-09-17 LAB — URINE DRUG SCREEN 8
Amphetamine: NEGATIVE
Barbiturates: NEGATIVE
Benzodiazepines: NEGATIVE
Cocaine: NEGATIVE
Creatinine Drug Screen: 373 mg/dL (ref >20)
Marijuana: NEGATIVE
Methadone Screen, urine: NEGATIVE
Opiates: NEGATIVE
Oxycodone: NEGATIVE
pH Drug Screen: 6 (ref 5.0–9.0)

## 2015-09-17 LAB — COMPREHENSIVE METABOLIC PANEL
ALT - Alanine Amino transferase: 28 IU/L (ref 7–52)
AST - Aspartate Aminotransferase: 25 IU/L (ref 10–50)
Albumin/Globulin Ratio: 1.2 (ref >0.9)
Albumin: 4.6 gm/dL (ref 3.5–5.0)
Alkaline Phosphatase: 67 IU/L (ref 34–104)
Anion Gap: 10.5 mmol/L (ref 3–11)
BUN: 21 mg/dL (ref 6.0–23.0)
Bilirubin, Total: 0.5 mg/dL (ref 0.3–1.2)
CO2 - Carbon Dioxide: 25.5 mmol/L (ref 21–31)
Calcium: 10.2 mg/dL (ref 8.6–10.3)
Chloride: 104 mmol/L (ref 98–111)
Creatinine, Serum: 0.99 mg/dL (ref 0.65–1.30)
GFR Estimate: >60 mL/min/{1.73_m2} (ref >60)
Globulin: 3.7 gm/dL (ref 2.2–3.7)
Glucose: 98 mg/dL (ref 80–99)
Potassium: 3.9 mmol/L (ref 3.5–5.1)
Protein, Total: 8.3 gm/dL — ABNORMAL HIGH (ref 6.0–8.0)
Sodium: 140 mmol/L (ref 135–143)

## 2015-09-17 LAB — URINALYSIS, ROUTINE
ASCORBIC ACID: NEGATIVE
Bacteria, UA: NONE SEEN /HPF
Bilirubin, urine: NEGATIVE
Blood, urine: NEGATIVE
Glucose, UA: NEGATIVE mg/dl
Hyaline Casts, UA: 16 /LPF — ABNORMAL HIGH (ref 0–1)
Ketones, urine: NEGATIVE
Leukocyte Esterase, urine: NEGATIVE
Nitrite, urine: NEGATIVE
Protein, urine: NEGATIVE
RBC, UA: <1 /HPF (ref 0–5)
SQUAMOUS EPITHELIAL, UA: 0 /HPF (ref 0–6)
Specific Gravity, urine: 1.026 (ref 1.002–1.035)
Urobilinogen, urine: NEGATIVE EU/dL
WBC, UA: 1 /HPF (ref 0–3)
pH, UA: 5 (ref 5.0–9.0)

## 2015-09-17 LAB — TSH: TSH - Thyroid Stimulating Hormone: 0.97 microIU/mL (ref 0.40–5.80)

## 2015-09-17 LAB — CBC WITH AUTO DIFFERENTIAL
Basophils %: 1 % (ref 0–2)
Basophils, Absolute: 0.1 10*3/uL (ref 0–0.2)
Eosinophils %: 0 % (ref 0–7)
Eosinophils, Absolute: 0 10*3/uL (ref 0–0.7)
HCT: 45.2 % (ref 42.0–54.0)
Hgb: 15.8 gm/dL (ref 12.0–18.0)
Lymphocytes %: 22 % — ABNORMAL LOW (ref 25–45)
Lymphocytes, Absolute: 2.9 10*3/uL (ref 1.1–4.3)
MCH: 29.6 pg (ref 27–34)
MCHC: 34.9 gm/dL (ref 32–36)
MCV: 84.7 fL (ref 81–99)
MPV: 7.7 fL (ref 7.4–10.4)
Monocytes %: 11 % (ref 0–12)
Monocytes, Absolute: 1.5 10*3/uL — ABNORMAL HIGH (ref 0–1.2)
Neutrophils, Absolute: 8.8 10*3/uL — ABNORMAL HIGH (ref 1.6–7.3)
Platelet Count: 293 10*3/uL (ref 150–400)
RBC: 5.34 10*6/uL (ref 4.70–6.10)
RDW: 13.1 % (ref 11.5–14.5)
Segs (Neutrophils)%: 66 % (ref 35–70)
WBC: 13.3 10*3/uL — ABNORMAL HIGH (ref 4.8–10.8)

## 2015-09-17 LAB — ALCOHOL, SERUM: Alcohol Scrn: <10 mg/dL (ref <10)

## 2015-09-17 MED ORDER — haloperidol lactate (HALDOL) injection 5 mg
5 | INTRAMUSCULAR | Status: DC | PRN
Start: 2015-09-17 — End: 2015-09-17

## 2015-09-17 MED ORDER — acetaminophen (TYLENOL) tablet 650 mg
325 | ORAL | Status: DC | PRN
Start: 2015-09-17 — End: 2015-09-18

## 2015-09-17 MED ORDER — haloperidol lactate (HALDOL) injection 5-10 mg
5 | INTRAMUSCULAR | Status: DC | PRN
Start: 2015-09-17 — End: 2015-09-18

## 2015-09-17 MED ORDER — hydrOXYzine pamoate (VISTARIL) capsule 50 mg
50 | ORAL | Status: DC | PRN
Start: 2015-09-17 — End: 2015-09-18

## 2015-09-17 MED ORDER — LORazepam (ATIVAN) tablet 1-2 mg
1 | ORAL | Status: DC | PRN
Start: 2015-09-17 — End: 2015-09-18
  Administered 2015-09-18: 04:00:00 1 mg via ORAL

## 2015-09-17 MED ORDER — magnesium hydroxide (MILK OF MAGNESIA) 400 mg/5 mL oral suspension 30 mL
400 | Freq: Every day | ORAL | Status: DC | PRN
Start: 2015-09-17 — End: 2015-09-18

## 2015-09-17 MED ORDER — diphenhydrAMINE (BENADRYL) injection 50 mg
50 | Freq: Every evening | INTRAMUSCULAR | Status: DC | PRN
Start: 2015-09-17 — End: 2015-09-17

## 2015-09-17 MED ORDER — ibuprofen (ADVIL,MOTRIN) tablet 400 mg
200 | ORAL | Status: DC | PRN
Start: 2015-09-17 — End: 2015-09-18

## 2015-09-17 MED ORDER — LORazepam (ATIVAN) injection 2 mg
2 | INTRAMUSCULAR | Status: DC | PRN
Start: 2015-09-17 — End: 2015-09-17

## 2015-09-17 MED ORDER — risperiDONE (RisperDAL M-TABS) disintegrating tablet 1-2 mg
1 | ORAL | Status: DC | PRN
Start: 2015-09-17 — End: 2015-09-18

## 2015-09-17 MED ORDER — diphenhydrAMINE (BENADRYL) injection 50 mg
50 | Freq: Four times a day (QID) | INTRAMUSCULAR | Status: DC | PRN
Start: 2015-09-17 — End: 2015-09-18

## 2015-09-17 MED ORDER — nicotine (NICODERM CQ) 14 mg/24 hr patch 1 patch
14 | Freq: Every day | TRANSDERMAL | Status: DC | PRN
Start: 2015-09-17 — End: 2015-09-18

## 2015-09-17 MED ORDER — haloperidol lactate (HALDOL) injection 5 mg
5 | Freq: Once | INTRAMUSCULAR | Status: AC
Start: 2015-09-17 — End: 2015-09-17
  Administered 2015-09-17: 09:00:00 5 mg via INTRAMUSCULAR

## 2015-09-17 MED ORDER — diphenhydrAMINE (BENADRYL) injection 50 mg
50 | Freq: Once | INTRAMUSCULAR | Status: AC
Start: 2015-09-17 — End: 2015-09-17
  Administered 2015-09-17: 09:00:00 50 mg via INTRAMUSCULAR

## 2015-09-17 MED ORDER — hydrOXYzine pamoate (VISTARIL) capsule 50 mg
50 | Freq: Every evening | ORAL | Status: DC | PRN
Start: 2015-09-17 — End: 2015-09-18

## 2015-09-17 MED ORDER — LORazepam (ATIVAN) injection 2 mg
2 | Freq: Once | INTRAMUSCULAR | Status: AC
Start: 2015-09-17 — End: 2015-09-17
  Administered 2015-09-17: 09:00:00 2 mg via INTRAMUSCULAR

## 2015-09-17 MED ORDER — OLANZapine (ZyPREXA ZYDIS) disintegrating tablet 5-10 mg
5 | Freq: Four times a day (QID) | ORAL | Status: DC | PRN
Start: 2015-09-17 — End: 2015-09-18

## 2015-09-17 MED ORDER — LORazepam (ATIVAN) tablet 2 mg
1 | Freq: Once | ORAL | Status: DC
Start: 2015-09-17 — End: 2015-09-17

## 2015-09-17 MED ORDER — LORazepam (ATIVAN) injection 1-2 mg
2 | INTRAMUSCULAR | Status: DC | PRN
Start: 2015-09-17 — End: 2015-09-18

## 2015-09-17 MED ORDER — OLANZapine (ZyPREXA ZYDIS) disintegrating tablet 15 mg
5 | Freq: Every evening | ORAL | Status: DC
Start: 2015-09-17 — End: 2015-09-18
  Administered 2015-09-18: 04:00:00 5 mg via ORAL

## 2015-09-17 MED ORDER — lamoTRIgine (LaMICtal) tablet 100 mg
25 | Freq: Every evening | ORAL | Status: DC
Start: 2015-09-17 — End: 2015-09-18
  Administered 2015-09-18: 04:00:00 25 mg via ORAL

## 2015-09-17 MED ORDER — aluminum-magnesium hydroxide-simethicone (MAALOX PLUS) 200-200-20 mg/5 mL oral suspension 30 mL
200-200-20 | ORAL | Status: DC | PRN
Start: 2015-09-17 — End: 2015-09-18

## 2015-09-17 MED FILL — DIPHENHYDRAMINE 50 MG/ML INJECTION SOLUTION: 50 mg/mL | INTRAMUSCULAR | Qty: 1

## 2015-09-17 MED FILL — LORAZEPAM 2 MG/ML INJECTION SOLUTION: 2 mg/mL | INTRAMUSCULAR | Qty: 1

## 2015-09-17 MED FILL — HALOPERIDOL LACTATE 5 MG/ML INJECTION SOLUTION: 5 mg/mL | INTRAMUSCULAR | Qty: 1

## 2015-09-17 MED FILL — LORAZEPAM 1 MG TABLET: 1 mg | ORAL | Qty: 2

## 2015-09-17 NOTE — ED Notes (Signed)
[facility] here to see pt.

## 2015-09-17 NOTE — ED Notes (Signed)
Friends and family visiting

## 2015-09-17 NOTE — Psychotherapy (Signed)
[id number]  [name]  11-Jun-1971    IN THE CIRCUIT COURT OF THE STATE [place] FOR [place]    In the Matter of  Richard Harrison  Alleged to be a mentally ill person NOTICE OF MENTAL ILLNESS   EMERGENCY HOSPITALIZATION  BY A PHYSICIAN OR NURSE PRACTITIONER    Case#: _______________________     TO THE JUDGE OF THE ABOVE COURT:   You are hereby notified that at 1:12 AM on 09/17/2015 the undersigned, a physician licensed to practice medicine by the [employer], or a nurse practitioner certified under ORS 678.375 and 678.390, hereby give notice, as required by ORS 426.234(2)(c), after completing a face to face examination of the above-named person and in consultation with: Dr. Cletis Media, a similarly qualified physician, Nurse Practitioner, or qualified mental health professional, neither of whom are related by blood or marriage to the above-named person, admitted or caused to be retained in: [facility], a hospital approved by the Mental Health and Developmental Disability Services Division where the undersigned has admitting privileges.    The condition of the above-named person, as set forth in writing below, caused the undersigned to believe that the above-named person is dangerous to self or others because the person exhibits the following: (Briefly describe specific examples of thoughts, plans, means, actions, history of dangerousness, or other indicators that support the physician's belief the person is imminently dangerous.):    The patient was brought to the emergency department by police, after his family called, concerned that he was making suicidal and homicidal ideation.        In addition, the undersigned believes that the above-named person is in need of emergency care or treatment for mental illness because the person exhibits the following: (Briefly describe specific indicators that support the physician's belief the person has a mental  disorder):    The patient has a history of bipolar disorder, and currently has very disorganized behavior with rambling, nonsensical speech, consistent with acute psychosis. When asked if he has any plans of harming himself or others he states yes, yes.     Richard Harrison    09/17/2015 1:12 AM    Electronically signed by:  Richard Harrison

## 2015-09-17 NOTE — ED Notes (Signed)
Dr. Neomia Dear at bedside to eval pt.

## 2015-09-17 NOTE — ED Notes (Signed)
Calls made around state for placement possibilities, which include [facility], [place]. [place], and [place].  No other appropriate beds available in state.

## 2015-09-17 NOTE — ED Notes (Signed)
Family in room. Dinner ordered

## 2015-09-17 NOTE — ED Notes (Signed)
Given toothbrush and toothpaste. In bathroom using them.
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
## 2015-09-17 NOTE — ED Notes (Signed)
Breakfast ordered 

## 2015-09-17 NOTE — ED Notes (Signed)
Family at bedside. 

## 2015-09-17 NOTE — Progress Notes (Signed)
HOME MEDICATION LIST REVIEWED BY PHARMACIST -  NOT ANTICIPATED TO CAUSE HARM     ? Home medication list in Epic was prepared during this inpatient visit, by a team member other than a pharmacist, in a best attempt to document medications that the patient takes in the outpatient setting    ? The pharmacist has reviewed the list, without direct patient communication, and has not identified any concerns that are anticipated to cause harm in a typical patient     ? Inaccuracies may still exist, and the home medication list has not been clinically evaluated for appropriate indications; please use clinical judgment when reconciling the list       Signed by: Talene Glastetter C. Dima Ferrufino, RPH

## 2015-09-17 NOTE — ED Notes (Signed)
Meal tray delivered.

## 2015-09-17 NOTE — ED Notes (Signed)
Wife, Clydie Braun [phone] in to visit pt. ROI signed.
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
## 2015-09-17 NOTE — ED Notes (Signed)
Custody warning explained and understood by pt. Pt did sign paper.

## 2015-09-18 MED FILL — LAMOTRIGINE 25 MG TABLET: 25 mg | ORAL | Qty: 4

## 2015-09-18 MED FILL — OLANZAPINE 5 MG DISINTEGRATING TABLET: 5 mg | ORAL | Qty: 1

## 2015-09-18 MED FILL — LORAZEPAM 1 MG TABLET: 1 mg | ORAL | Qty: 2

## 2015-09-18 MED FILL — ZYPREXA ZYDIS 10 MG DISINTEGRATING TABLET: 10 mg | ORAL | Qty: 1

## 2015-09-18 NOTE — ED Notes (Signed)
[facility] will be here in  45 minutes to an hour to pick him up.

## 2015-09-18 NOTE — ED Notes (Signed)
Dr. Anthonette Legato called and said that [facility] has talked with him and they have accepted him. Transportation and other paperwork pending.

## 2015-09-18 NOTE — ED Notes (Signed)
[facility] called requesting a current face sheet and their packet filled out and faxed to them. Done.

## 2015-09-18 NOTE — ED Notes (Signed)
[facility] called back and will call when they have secure transport arranged and give [place] a pick up time

## 2015-09-18 NOTE — ED Notes (Signed)
Message left with [employer].

## 2015-09-18 NOTE — ED Notes (Signed)
Packet faxed to [facility].

## 2015-09-25 ENCOUNTER — Inpatient Hospital Stay
Admit: 2015-09-25 | Discharge: 2015-09-27 | Disposition: A | Discharge status: Home / Self Care | Payer: MEDICARE | Attending: MD

## 2015-09-25 DIAGNOSIS — F319 Bipolar disorder, unspecified: Secondary | ICD-10-CM

## 2015-09-25 LAB — ALCOHOL, SERUM: Alcohol, Serum: <10 mg/dL (ref <10)

## 2015-09-25 LAB — COMPREHENSIVE METABOLIC PANEL
ALT - Alanine Aminotransferase: 30 IU/L (ref 7–52)
AST - Aspartate Aminotransferase: 31 IU/L (ref 10–50)
Albumin/Globulin Ratio: 1.3 (ref >0.9)
Albumin: 4.3 g/dL (ref 3.5–5.0)
Alkaline Phosphatase: 66 IU/L (ref 34–104)
Anion Gap: 12.4 mmol/L — ABNORMAL HIGH (ref 3.0–11.0)
BUN: 15 mg/dL (ref 6–23)
Bilirubin Total: 0.6 mg/dL (ref 0.3–1.2)
CO2 - Carbon Dioxide: 20.6 mmol/L — ABNORMAL LOW (ref 21.0–31.0)
Calcium: 9.5 mg/dL (ref 8.6–10.3)
Chloride: 104 mmol/L (ref 98–111)
Creatinine: 0.81 mg/dL (ref 0.65–1.30)
GFR Estimate: >60 mL/min/{1.73_m2} (ref >=60)
Globulin: 3.4 g/dL (ref 2.2–3.7)
Glucose: 110 mg/dL — ABNORMAL HIGH (ref 80–99)
Potassium: 4.1 mmol/L (ref 3.5–5.1)
Protein Total: 7.7 g/dL (ref 6.0–8.0)
Sodium: 137 mmol/L (ref 135–143)

## 2015-09-25 LAB — DRUG SCREEN PANEL 8
Amphetamine: NEGATIVE
Barbiturates: NEGATIVE
Benzodiazepines: NEGATIVE
Cocaine: NEGATIVE
Creatinine, Urine: 139 mg/dL (ref >20)
Marijuana: NEGATIVE
Methadone: NEGATIVE
Opiates: NEGATIVE
Oxycodone: NEGATIVE

## 2015-09-25 LAB — CBC WITH AUTO DIFFERENTIAL
Basophils %: 0 % (ref 0–2)
Basophils, Absolute: 0 10*3/ÂµL (ref 0.0–0.2)
Eosinophils %: 0 % (ref 0–7)
Eosinophils, Absolute: 0 10*3/ÂµL (ref 0.0–0.7)
HCT: 43.4 % (ref 42.0–54.0)
Hemoglobin: 15.1 g/dL (ref 12.0–18.0)
Lymphocytes %: 6 % — ABNORMAL LOW (ref 25–45)
Lymphocytes, Absolute: 1.1 10*3/ÂµL (ref 1.1–4.3)
MCH: 29.6 pg (ref 27.0–34.0)
MCHC: 34.8 g/dL (ref 32.0–36.0)
MCV: 85 fL (ref 81.0–99.0)
MPV: 8 fL (ref 7.4–10.4)
Monocytes %: 9 % (ref 0–12)
Monocytes, Absolute: 1.5 10*3/ÂµL — ABNORMAL HIGH (ref 0.0–1.2)
Neutrophils %: 85 % — ABNORMAL HIGH (ref 35–70)
Neutrophils, Absolute: 14.9 10*3/ÂµL — ABNORMAL HIGH (ref 1.6–7.3)
Platelet Count: 272 10*3/ÂµL (ref 150–400)
RBC: 5.11 10*6/ÂµL (ref 4.70–6.10)
RDW: 13 % (ref 11.5–14.5)
WBC: 17.5 10*3/ÂµL — ABNORMAL HIGH (ref 4.8–10.8)

## 2015-09-25 LAB — UA WITH REFLEX MICRO
Ascorbic Acid, Urine: NEGATIVE
Bacteria, Urine: NONE SEEN /HPF
Bilirubin, Urine: NEGATIVE
Blood, Urine: NEGATIVE
Glucose, Urine: NEGATIVE mg/dL
Hyaline Casts, Urine: 1 /LPF (ref <=5)
Ketones, Urine: NEGATIVE mg/dL
Leukocyte Esterase, Urine: NEGATIVE
Nitrite, Urine: NEGATIVE
Protein, Urine: NEGATIVE mg/dL
RBC, Urine: <1 /HPF (ref <=5)
Specific Gravity, Urine: 1.017 (ref 1.003–1.030)
Squamous Epithelial, Urine: 0 /HPF (ref <=5)
Urobilinogen, Urine: NORMAL mg/dL
White Blood Cell Microscopic Numeric: 1 /HPF (ref <=9)
pH, UA: 6 (ref 5.0–7.5)

## 2015-09-25 LAB — URINE CULTURE

## 2015-09-25 LAB — TSH: TSH - Thyroid Stimulating Hormone: 1.16 u[IU]/mL (ref 0.34–5.60)

## 2015-09-25 MED ORDER — LORazepam (ATIVAN) injection 2 mg
2 | Freq: Once | INTRAMUSCULAR | Status: AC
Start: 2015-09-25 — End: 2015-09-25
  Administered 2015-09-25: 21:00:00 2 mg via INTRAMUSCULAR

## 2015-09-25 MED ORDER — diphenhydrAMINE (BENADRYL) injection 25 mg
50 | Freq: Once | INTRAMUSCULAR | Status: AC
Start: 2015-09-25 — End: 2015-09-25
  Administered 2015-09-25: 21:00:00 50 mg via INTRAMUSCULAR

## 2015-09-25 MED ORDER — haloperidol lactate (HALDOL) injection 5 mg
5 | Freq: Once | INTRAMUSCULAR | Status: AC
Start: 2015-09-25 — End: 2015-09-25
  Administered 2015-09-25: 21:00:00 5 mg via INTRAMUSCULAR

## 2015-09-25 MED FILL — LORAZEPAM 2 MG/ML INJECTION SOLUTION: 2 mg/mL | INTRAMUSCULAR | Qty: 1

## 2015-09-25 MED FILL — HALOPERIDOL LACTATE 5 MG/ML INJECTION SOLUTION: 5 mg/mL | INTRAMUSCULAR | Qty: 1

## 2015-09-25 MED FILL — DIPHENHYDRAMINE 50 MG/ML INJECTION SOLUTION: 50 mg/mL | INTRAMUSCULAR | Qty: 1

## 2015-09-25 NOTE — ED Provider Notes (Signed)
I assumed care of this patient at shift change from Dr. Renda Rolls. The patient is a history of bipolar disorder and presents with manic behaviors and agitation. The patient did require a significant amount of sedation, and during my shift [facility] mental health attempted to assess the patient. Due to the level of sedation, they felt that this assessment was suboptimal and have requested for [place] to keep the patient overnight so that another assessment could be attempted in the morning. The patient has continued to be sedated throughout my period of supervision. Care of this patient will be transitioned to Dr. Renato Shin at shift change.        SNOMED CT(R)   1. Manic behavior  MANIC BEHAVIOR   2. Agitation  FEELING AGITATED          De Hollingshead, MD  09/25/15 2302

## 2015-09-25 NOTE — ED Notes (Signed)
UA specimen collected and sent to the lab.

## 2015-09-25 NOTE — ED Provider Notes (Addendum)
I assumed care of this patient from Dr. Tiburcio Pea at shift change. The patient is on a mental health hold and being kept for psychiatric evaluation in the morning.    Results for orders placed or performed during the hospital encounter of 09/25/15 (from the past 24 hour(s))   CBC with Auto Differential -STAT    Collection Time: 09/25/15  2:43 PM   Result Value Ref Range    WBC 17.5 (H) 4.8 - 10.8 10*3/?L    RBC 5.11 4.70 - 6.10 10*6/?L    Hemoglobin 15.1 12.0 - 18.0 g/dL    HCT 16.1 09.6 - 04.5 %    MCV 85.0 81.0 - 99.0 fL    MCH 29.6 27.0 - 34.0 pg    MCHC 34.8 32.0 - 36.0 g/dL    RDW 40.9 81.1 - 91.4 %    Platelet Count 272 150 - 400 10*3/?L    MPV 8.0 7.4 - 10.4 fL    Neutrophils % 85 (H) 35 - 70 %    Lymphocytes % 6 (L) 25 - 45 %    Monocytes % 9 0 - 12 %    Eosinophils % 0 0 - 7 %    Basophils % 0 0 - 2 %    Neutrophils, Absolute 14.9 (H) 1.6 - 7.3 10*3/?L    Lymphocytes, Absolute 1.1 1.1 - 4.3 10*3/?L    Monocytes, Absolute 1.5 (H) 0.0 - 1.2 10*3/?L    Eosinophils, Absolute 0.0 0.0 - 0.7 10*3/?L    Basophils, Absolute 0.0 0.0 - 0.2 10*3/?L   Comprehensive Metabolic Panel -STAT    Collection Time: 09/25/15  2:43 PM   Result Value Ref Range    Sodium 137 135 - 143 mmol/L    Potassium 4.1 3.5 - 5.1 mmol/L    Chloride 104 98 - 111 mmol/L    CO2 - Carbon Dioxide 20.6 (L) 21.0 - 31.0 mmol/L    Glucose 110 (H) 80 - 99 mg/dL    BUN 15 6 - 23 mg/dL    Creatinine 7.82 9.56 - 1.30 mg/dL    Calcium 9.5 8.6 - 21.3 mg/dL    AST - Aspartate Aminotransferase 31 10 - 50 IU/L    ALT - Alanine Amino transferase 30 7 - 52 IU/L    Alkaline Phosphatase 66 34 - 104 IU/L    Bilirubin Total 0.6 0.3 - 1.2 mg/dL    Protein Total 7.7 6.0 - 8.0 g/dL    Albumin 4.3 3.5 - 5.0 g/dL    Globulin 3.4 2.2 - 3.7 g/dL    Albumin/Globulin Ratio 1.3 >0.9    Anion Gap 12.4 (H) 3.0 - 11.0 mmol/L    GFR Estimate >60 >=60 mL/min/1.72m*2    GFR Additional Info     Thyroid Stimulating Hormone -STAT    Collection Time: 09/25/15  2:43 PM   Result Value Ref  Range    TSH - Thyroid Stimulating Hormone 1.16 0.34 - 5.60 ?IU/mL   Alcohol Medical/Serum -STAT    Collection Time: 09/25/15  2:43 PM   Result Value Ref Range    Alcohol Screen Serum <10 <10 mg/dL   Drug Screen Panel 8 -STAT    Collection Time: 09/25/15  4:03 PM   Result Value Ref Range    Creatinine, Urine 139 >20 mg/dL    Amphetamine Negative Negative    Barbiturates Negative Negative    Benzodiazepines Negative Negative    Cocaine Negative Negative    Marijuana Negative Negative    Opiates  Negative Negative    Oxycodone Negative Negative    Methadone Negative Negative   Urinalysis with Reflex Microscopic -Once    Collection Time: 09/25/15  4:03 PM   Result Value Ref Range    Color, Urine Yellow Yellow, Straw    Clarity, Urine Clear Clear    Glucose, Urine Negative Negative mg/dL    Bilirubin, Urine Negative Negative    Ketones, Urine Negative Negative mg/dL    Specific Gravity, Urine 1.017 1.003 - 1.030    Blood, Urine Negative Negative    pH, UA 6.0 5.0 - 7.5    Protein, Urine Negative Negative mg/dL    Urobilinogen, Urine Normal Normal mg/dL    Nitrite, Urine Negative Negative    Leukocyte Esterase, Urine Negative Negative    Ascorbic Acid, Urine Negative Negative    White Blood Cell Microscopic Numeric 1 <=9 /HPF    RBC, Urine <1 <=5 /HPF    Bacteria, Urine None Seen None Seen /HPF    Squamous Epithelial, Urine 0 <=5 /HPF    Hyaline Casts, Urine 1 <=5 /LPF    Mucous, Urine 3+ (A) 0 /LPF     Urine drug screen is negative, urinalysis is negative, white count is minimally elevated at 17.5 with 85% neutrophils showing a left shift which is nonspecific, chemistry panel shows no clinically significant abnormalities, TSH is normal, alcohol is undetectable.    Care of the patient will be assumed by Dr. Ilene Qua at shift change in the morning.           Noralyn Pick, MD  09/26/15 [phone]

## 2015-09-25 NOTE — ED Notes (Signed)
Pt's fiance, Richard Harrison, called to check on Richard Harrison, who has just arrived in PCU.  Her phone number is [phone].
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
## 2015-09-25 NOTE — ED Notes (Signed)
Lab draw completed with patient cooperative.   VS also allowed.

## 2015-09-25 NOTE — ED Notes (Signed)
Patient's fiance is Clydie Braun. Her number is [phone]. She has the home medication list for patient at her house.

## 2015-09-25 NOTE — ED Notes (Signed)
[facility] here to assess pt. Pt. Is currently sound asleep. [facility] will come back later tonight or will just wait for psychiatrist in the morning.

## 2015-09-25 NOTE — ED Notes (Signed)
Assistance has been offered to patient with urinal but patient continues to refuse, insisting multiple women must help him with holding his penis.

## 2015-09-26 ENCOUNTER — Emergency Department: Admit: 2015-09-26 | Payer: MEDICARE

## 2015-09-26 MED ORDER — QUEtiapine (SEROQUEL) 200 MG tablet
200 | ORAL_TABLET | ORAL | 0 refills | 30.00000 days | Status: DC
Start: 2015-09-26 — End: 2015-10-08

## 2015-09-26 MED ORDER — HYDROcodone-acetaminophen (NORCO) 10-325 mg per tablet
10-325 | ORAL_TABLET | Freq: Four times a day (QID) | ORAL | 0 refills | 30.00000 days | Status: DC | PRN
Start: 2015-09-26 — End: 2023-06-22

## 2015-09-26 MED ORDER — HYDROcodone-acetaminophen (NORCO) 10-325 mg per tablet 1 tablet
10-325 | Freq: Once | ORAL | Status: AC
Start: 2015-09-26 — End: 2015-09-26
  Administered 2015-09-26: 23:00:00 10-325 via ORAL

## 2015-09-26 MED FILL — HYDROCODONE 10 MG-ACETAMINOPHEN 325 MG TABLET: 10-325 mg | ORAL | Qty: 1

## 2015-09-26 NOTE — ED Notes (Signed)
Packet faxed to [facility]

## 2015-09-26 NOTE — ED Notes (Signed)
Pt is boarding overnight to be evaluated by the on call psychiatrist; he has been sedated and continues to sleep, lying on the gurney, snoring. He has been asleep all shift so far.

## 2015-09-26 NOTE — ED Notes (Signed)
Food arrived & given to pt

## 2015-09-26 NOTE — ED Notes (Signed)
Pt describes the events that led to his being out of control. He states it feels like a nightmare when he loses it like that. He states he does not want to be like this any longer and he does want help. He is open to learning coping skills and he is aware of his triggers.He sees Berna Spare and feels she is very helpful.He has asked me to call her and let her know he will miss his appointment today. I left a message

## 2015-09-26 NOTE — ED Notes (Signed)
Rn @bedside , X-ray here taking pt to have X-ray done. Food order placed for pt

## 2015-09-26 NOTE — ED Notes (Signed)
packet faxed  to [facility]

## 2015-09-26 NOTE — ED Notes (Signed)
Called around for secure bed for pt couldn't find one at the moment

## 2015-09-26 NOTE — Other (Signed)
[id number]  Richard Harrison  03-12-1971    IN THE CIRCUIT COURT OF THE STATE OF [place] FOR [place]    In the Matter of  Gabor Lusk  Alleged to be a mentally ill person NOTICE OF RELEASE FROM HOSPITAL DETENTION BY A PHYSICIAN OR NURSE PRACTITIONER    Case#: _______________________     TO THE JUDGE OF THE ABOVE COURT:   Lemont Fillers, the responsible physician, or a nurse practitioner certified under ORS 678.375 and 678.390, hereby give notice, as required by ORS 426.234(2)(c), that on 09/26/2015, I determine that Richard Harrison, a person who was admitted to my care at [facility] under the provisions of ORS 426.232 on 09/25/15 (enter date Notice of Mental Illness was signed), no longer was dangerous to self or others and no longer was in need of emergency care or treatment for mental illness and therefore I released the above-named person from detention as provided by ORS 426.234(2)(c).     Sherron Ales    09/26/2015 4:42 PM    Electronically signed by:  Sherron Ales

## 2015-09-26 NOTE — ED Notes (Signed)
Breakfast ordered 

## 2015-09-26 NOTE — ED Notes (Signed)
Pt verbalized understanding of D/C instructions.  Pt appreciative of care.

## 2015-09-26 NOTE — ED Notes (Signed)
Pt back from X-ray.  
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
## 2015-09-26 NOTE — Other (Signed)
Updated packet faxed to [facility]. Reviewed and received per Daron. She states that Pt is on the pending transfer list but that since he was in restraints within the last 24 hours, he will be on their aggression protocol which dictates they cannot take a Pt overnight due to lower staffing. Daron wants staff to call back at 7am to check status. Pt is not accepted yet, but remains a candidate for admission.

## 2015-09-26 NOTE — ED Notes (Signed)
O'connell @bedside

## 2015-09-26 NOTE — ED Provider Notes (Signed)
SBAR from Dr Cletis Media at change of shift. Patient on a mental health hold. Patient has been medically cleared.  Awaiting psychiatric evaluation.     Dr Donald Prose assumed care of this patient at approximately 8:30. Please see her note for details and disposition.     Geraldine Contras, MD  09/26/15 1328

## 2015-10-04 ENCOUNTER — Emergency Department: Admit: 2015-10-04 | Payer: MEDICARE

## 2015-10-04 ENCOUNTER — Inpatient Hospital Stay
Admission: EM | Admit: 2015-10-04 | Discharge: 2015-10-08 | Disposition: A | Discharge status: Home / Self Care | Payer: MEDICARE | Admitting: Psychiatry

## 2015-10-04 DIAGNOSIS — F319 Bipolar disorder, unspecified: Principal | ICD-10-CM

## 2015-10-04 LAB — CBC WITH AUTO DIFFERENTIAL
Basophils %: 1 % (ref 0–2)
Basophils, Absolute: 0.1 10*3/ÂµL (ref 0.0–0.2)
Eosinophils %: 0 % (ref 0–7)
Eosinophils, Absolute: 0.1 10*3/ÂµL (ref 0.0–0.7)
HCT: 46.9 % (ref 42.0–54.0)
Hemoglobin: 15.6 g/dL (ref 12.0–18.0)
Lymphocytes %: 14 % — ABNORMAL LOW (ref 25–45)
Lymphocytes, Absolute: 2.1 10*3/ÂµL (ref 1.1–4.3)
MCH: 28.4 pg (ref 27.0–34.0)
MCHC: 33.3 g/dL (ref 32.0–36.0)
MCV: 85.2 fL (ref 81.0–99.0)
MPV: 7.3 fL — ABNORMAL LOW (ref 7.4–10.4)
Monocytes %: 9 % (ref 0–12)
Monocytes, Absolute: 1.3 10*3/ÂµL — ABNORMAL HIGH (ref 0.0–1.2)
Neutrophils %: 77 % — ABNORMAL HIGH (ref 35–70)
Neutrophils, Absolute: 11.9 10*3/ÂµL — ABNORMAL HIGH (ref 1.6–7.3)
Platelet Count: 299 10*3/ÂµL (ref 150–400)
RBC: 5.5 10*6/ÂµL (ref 4.70–6.10)
RDW: 13.3 % (ref 11.5–14.5)
WBC: 15.5 10*3/ÂµL — ABNORMAL HIGH (ref 4.8–10.8)

## 2015-10-04 LAB — UA WITH REFLEX MICRO
Ascorbic Acid, Urine: NEGATIVE
Bacteria, Urine: NONE SEEN /HPF
Bilirubin, Urine: NEGATIVE
Blood, Urine: NEGATIVE
Glucose, Urine: NEGATIVE mg/dL
Hyaline Casts, Urine: 5 /LPF (ref <=5)
Leukocyte Esterase, Urine: NEGATIVE
Nitrite, Urine: NEGATIVE
RBC, Urine: 0 /HPF (ref <=5)
Specific Gravity, Urine: 1.029 (ref 1.003–1.030)
Squamous Epithelial, Urine: <1 /HPF (ref <=5)
Urobilinogen, Urine: NORMAL mg/dL
White Blood Cell Microscopic Numeric: 0 /HPF (ref <=9)
pH, UA: 5 (ref 5.0–7.5)

## 2015-10-04 LAB — DRUG SCREEN PANEL 8
Amphetamine: NEGATIVE
Barbiturates: NEGATIVE
Benzodiazepines: NEGATIVE
Cocaine: NEGATIVE
Creatinine, Urine: 352 mg/dL (ref >20)
Marijuana: NEGATIVE
Methadone: NEGATIVE
Opiates: NEGATIVE
Oxycodone: NEGATIVE

## 2015-10-04 LAB — COMPREHENSIVE METABOLIC PANEL
ALT - Alanine Aminotransferase: 28 IU/L (ref 7–52)
AST - Aspartate Aminotransferase: 31 IU/L (ref 10–50)
Albumin/Globulin Ratio: 1 (ref >0.9)
Albumin: 4.3 g/dL (ref 3.5–5.0)
Alkaline Phosphatase: 71 IU/L (ref 34–104)
Anion Gap: 13.2 mmol/L — ABNORMAL HIGH (ref 3.0–11.0)
BUN: 25 mg/dL — ABNORMAL HIGH (ref 6–23)
Bilirubin Total: 1.1 mg/dL (ref 0.3–1.2)
CO2 - Carbon Dioxide: 20.8 mmol/L — ABNORMAL LOW (ref 21.0–31.0)
Calcium: 9.5 mg/dL (ref 8.6–10.3)
Chloride: 105 mmol/L (ref 98–111)
Creatinine: 0.94 mg/dL (ref 0.65–1.30)
GFR Estimate: >60 mL/min/{1.73_m2} (ref >=60)
Globulin: 4.1 g/dL — ABNORMAL HIGH (ref 2.2–3.7)
Glucose: 108 mg/dL — ABNORMAL HIGH (ref 80–99)
Potassium: 3.7 mmol/L (ref 3.5–5.1)
Protein Total: 8.4 g/dL — ABNORMAL HIGH (ref 6.0–8.0)
Sodium: 139 mmol/L (ref 135–143)

## 2015-10-04 LAB — ALCOHOL, SERUM: Alcohol, Serum: <10 mg/dL (ref <10)

## 2015-10-04 LAB — URINE CULTURE: Culture Result: NO GROWTH

## 2015-10-04 LAB — GLYCO-HEMOGLOBIN A1C
Estimated Average Glucose: 103 mg/dL
Glycohemoglobin (A1c): 5.2 % (ref 4.3–6.1)

## 2015-10-04 LAB — TSH: TSH - Thyroid Stimulating Hormone: 1.67 u[IU]/mL (ref 0.34–5.60)

## 2015-10-04 MED ORDER — OLANZapine (ZyPREXA ZYDIS) disintegrating tablet 20 mg
10 | Freq: Once | ORAL | Status: AC
Start: 2015-10-04 — End: 2015-10-04
  Administered 2015-10-04: 20:00:00 10 mg via ORAL

## 2015-10-04 MED ORDER — QUEtiapine (SEROquel) tablet 25 mg
25 | Freq: Once | ORAL | Status: AC
Start: 2015-10-04 — End: 2015-10-04
  Administered 2015-10-04: 22:00:00 25 mg via ORAL

## 2015-10-04 MED ORDER — QUEtiapine (SEROquel XR) 24 hr tablet 400 mg
200 | Freq: Every evening | ORAL | Status: DC
Start: 2015-10-04 — End: 2015-10-08
  Administered 2015-10-05 – 2015-10-08 (×4): 200 mg via ORAL

## 2015-10-04 MED ORDER — lamoTRIgine (LaMICtal) tablet 200 mg
100 | Freq: Every evening | ORAL | Status: DC
Start: 2015-10-04 — End: 2015-10-07
  Administered 2015-10-05 – 2015-10-07 (×3): 100 mg via ORAL

## 2015-10-04 MED ORDER — HYDROcodone-acetaminophen (NORCO) 10-325 mg per tablet 1 tablet
10-325 | Freq: Four times a day (QID) | ORAL | Status: DC | PRN
Start: 2015-10-04 — End: 2015-10-08
  Administered 2015-10-04 – 2015-10-08 (×7): 10-325 via ORAL

## 2015-10-04 MED FILL — ZYPREXA ZYDIS 10 MG DISINTEGRATING TABLET: 10 mg | ORAL | Qty: 2

## 2015-10-04 MED FILL — HYDROCODONE 10 MG-ACETAMINOPHEN 325 MG TABLET: 10-325 mg | ORAL | Qty: 1

## 2015-10-04 MED FILL — QUETIAPINE 25 MG TABLET: 25 mg | ORAL | Qty: 1
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
## 2015-10-04 NOTE — ED Notes (Signed)
PCU report from RN to ED Dr.    Coralie Keens in by: Wynonia Musty    Type of hold: Voluntary    Chief complaint and why did they come into the ED: Released from jail this morning after trespassing at [employer] yesterday. Released into Fiance's custody; Fiance brought pt to hospital d/t delusional/psychotic state. Pt unable to be assessed; unable to answer any questions. Pt tangential, repeatedly stating What the world needs now is love sweet love with hand motions. Pt recently discharged from [facility]. Two party petition underway; [facility] investigating. [facility], Aurther Loft, notified that patient at the hospital.    Mental Health diagnosis: Bipolar; PTSD    Brief history of self harm/suicide attempts/harming others? Unable to assess    Medical diagnosis: Reports left large toe nail missing r/t arrest.    Collateral information from family/friends: Wynonia Musty     Disposition idea for patient: Collect UA; to be Evaluated by Psychiatry.

## 2015-10-04 NOTE — ED Notes (Signed)
Belongings put away, security check done.

## 2015-10-04 NOTE — ED Notes (Signed)
Report and transfer of care from [facility] PCU.  [facility] and Dr Gershon Crane would like to see pt get the bed opening on BHU tomorrow.  Will look for alternative bed availability.  Pt sleeping

## 2015-10-04 NOTE — ED Provider Notes (Signed)
I saw this patient in my role as the ED provider at triage.  As such I did limited history and physical exam to confirm the patient's triage level and order appropriate studies (if any) to begin the patient's workup. The patient understands that a further evaluation will be done when they are brought back to an ED room.      Chief complaint: BIB fiance concern for psychiatric eval. Pt has bipolar disorder, he was in jail last night and did not receive his bipolar medications. Now acting strangely per fiance, repeating words and phrases.   Pertinent findings: tangential speech, repeating phrases.  Plan: to room           Mick Sell, FNP  10/04/15 1123

## 2015-10-04 NOTE — Psychotherapy (Signed)
[id number]  Richard Harrison  29-Jan-1971    IN THE CIRCUIT COURT OF THE [place]    In the Matter of  Richard Harrison  Alleged to be a mentally ill person NOTICE OF MENTAL ILLNESS   EMERGENCY HOSPITALIZATION  BY A PHYSICIAN OR NURSE PRACTITIONER     TO THE JUDGE OF THE ABOVE COURT:   You are hereby notified that at 3:00 PM on 10/04/2015 the undersigned, a physician licensed to practice medicine by the [employer], or a nurse practitioner certified under ORS 678.375 and 678.390, hereby give notice, as required by ORS 426.234(2)(c), after completing a face to face examination of the above-named person and in consultation with: Beola Cord a similarly qualified physician, Nurse Practitioner, or qualified mental health professional, neither of whom are related by blood or marriage to the above-named person, admitted or caused to be retained in: [facility], a hospital approved by the Mental Health and Developmental Disability Services Division where the undersigned has admitting privileges.    The condition of the above-named person, as set forth in writing below, caused the undersigned to believe that the above-named person is dangerous to self or others because the person exhibits the following: (Briefly describe specific examples of thoughts, plans, means, actions, history of dangerousness, or other indicators that support the physician's belief the person is imminently dangerous.):    patient is disorganized, unable to care for self.  Has lost touch with reality; unable to communicate coherently. Disorganized thoughts, speech.  Unable to answer questions appropriately during interview; repeats What the world needs is love, sweet love.      In addition, the undersigned believes that the above-named person is in need of emergency care or treatment for mental illness because the person exhibits the following: (Briefly describe specific  indicators that support the physician's belief the person has a mental disorder):    history of bipolar disorder; recently hospitalized last week.   Patient is off medications, demonstrates poor insight and limited judgment.  Recently arrested for trespassing at [employer], sent to hospital from jail.    Sherron Ales    10/04/2015 3:00 PM    Electronically signed by:  Sherron Ales

## 2015-10-04 NOTE — ED Notes (Addendum)
Lunch ordered through Fiance. Waiting for diet order, Requested and received ice water.

## 2015-10-04 NOTE — ED Notes (Signed)
Patients fiance Clydie Braun stated the patient is waking up and making more sense.

## 2015-10-04 NOTE — Consults (Signed)
Psychiatric Consult Note    Name: Richard Harrison  DOB: [birth year] 44 y.o.  MRN: [id number]  CSN: [id number]    Identification:  44 yo male sent from jail for assessment of disorganized thoughts, erratic behavior.    Chief Complaint:  All the world needs is love    History of Present Illness:  Patient with hx of Bipolar Disorder, seen here last week and sent home with Rx for Seroquel, who was arrested yesterday for trespassing at [employer].  Upon release from jail, he was sent to the ED due to his obvious mania and disorganization.  Per ED notes,  Released from jail this morning after trespassing at [employer] yesterday. Released into Fiance's custody; Fiance brought pt to hospital d/t delusional/psychotic state. Pt unable to be assessed; unable to answer any questions. Pt tangential, repeatedly stating What the world needs now is love sweet love with hand motions. Pt recently discharged from [facility]. Two party petition underway; [facility] investigating.     The patient's sister died recently, which seems to have been the trigger leading to the current manic episode.  He presented to the ED on 09/17/15, but was sent to [facility] due to lack of bed availability on the BHU.  Upon discharge, he was dropped off at [facility] where he waited four hours for his ride to arrive.  During this period, the patient became highly agitated, aggressive, and ultimately police were summoned.  He was promptly brought back to the ED at [facility].  He was very agitated upon initial presentation, requiring four-point restraints and IM medication.  He slept for approximately 20 hours.  When I finally evaluated him on 09/26/15, he was much less agitated, cooperative, and demonstrated clear and coherent thoughts.  He was not psychotic or suicidal, and was discharged home with his fiancee.  Prior to release, I recommended increasing Seroquel to 400-600mg  nightly in order to achieve adequate sleep.  He was discharged to the care of his fiancee.    It  is not clear what has transpired since he left the ED on 09/26/15.  The patient's speech is nonsensical, and he is using sign language and singing songs about the world need slow sweet love, which he keeps repeating. He has been ringing doorbells and believes Richard Harrison is an amazing band. Patient is not at all redirectable, and essentially unable to provide any meaningful history.    Past Psychiatric History:  Patient is followed on an outpatient basis by Dr. Elmarie Harrison at [facility] and also Richard Harrison.  He has not responded well to Lithium in the past.  Most recently started on Seroquel 200mg  at bedtime.  As noted above, patient was hospitalized at [facility] from 09/17/15 to 09/25/15.  Hospitalized on the BHU in 2004, but records are not available.    Past Medical History:  Past Medical History   Diagnosis Date   . Bipolar affective disorder    . Hypertension    . Kidney stone      Allergies:  Penicillins    Current Medications:  Prior to Admission Medications    Medication Dose & Frequency   lamoTRIgine (LAMICTAL) 100 MG tablet Take 200 mg by mouth nightly.    QUEtiapine (SEROQUEL XR) 400 MG 24 hr tablet Take 400 mg by mouth nightly.   traZODone (DESYREL) 100 MG tablet Take 100 mg by mouth nightly.   HYDROcodone-acetaminophen (NORCO) 10-325 mg per tablet Take 1 tablet by mouth every 6 (six) hours as needed for Pain.  QUEtiapine (SEROQUEL) 200 MG tablet Take one to two additional tabs as needed for insomnia.        Social History:  Social History   Substance Use Topics   . Smoking status: Never Smoker   . Smokeless tobacco: Never Used   . Alcohol use Yes       Family History:  No family history on file.    Vital Signs:  BP: 134/83  Pulse: 104  Resp: 20  Temp: 36.8 ?C (98.2 ?F)  SpO2: 98 % (10/13 1119-10/14 0754)     Mental Status Evaluation:  Appearance: has shaved head, disheveled, seated in wheelchair.  Rambling, intrusive  Orientation:  Fully oriented  Speech: pressured, loud, repetitive  Mood: manic  Affect:   Congruent, labile, exaggerated  Thought process: disorganized; nonsensical   Thought content:  No overt paranoia or bizarre delusions; mainly disorganized content  Hallucinations, perceptual phenomena:  Denies hallucinations  Memory:  Unable to assess  Fund of Knowledge: UTA  Suicidal Ideation: denies  Thoughts of harming others: denies  Insight, Judgment: poor  Impulse control: poor  Sensorium: intact, does not fluctuate  No abnormal movements, tics or tremors; but patient highly restless, hyperkinetic  Musculoskeletal system: no focal weakness; moves extremities spontaneously  Gait and station: ambulates without assistance; no evidence of gait disorder      Recent Labs:  No results found for this visit on 10/04/15 (from the past 24 hour(s)).  Pending Labs     Order Current Status    Light Green Top (Short) -Once In process    Rainbow Draw [facility]) -STAT In process    Culture, Urine -Next Routine Preliminary result        Recent imaging:  No results found.    Impression:  Acute Bipolar Mania, probably due to medication non-adherence.  At this point, the patient is obviously disorganized and unable to care for self.  He has been placed on a 2-MD hold and we will seek acute inpatient admission.      Recommendations:  1. Patient is on a 2-MD hold  2. Observe in ED; given agitation and previous aggression, he may require a Secure bed.  3. Restart home medications.  4. Wound care management for toenail    Richard Harrison  10/05/2015  6:15 PM

## 2015-10-04 NOTE — ED Notes (Signed)
Pt sleeping on gurney in ER 26 in NAD with sig other present.  RR even and unlabored.  Lights out and curtains closed for comfort.  Pt in direct sight of staff for safety.

## 2015-10-04 NOTE — ED Triage Notes (Signed)
Altered mental status, bipolar

## 2015-10-05 MED ORDER — aluminum-magnesium hydroxide-simethicone (MAALOX PLUS) 200-200-20 mg/5 mL oral suspension 30 mL
200-200-20 | ORAL | Status: DC | PRN
Start: 2015-10-05 — End: 2015-10-08
  Administered 2015-10-08: 13:00:00 200-200-20 mL via ORAL

## 2015-10-05 MED ORDER — acetaminophen (TYLENOL) tablet 650 mg
325 | ORAL | Status: DC | PRN
Start: 2015-10-05 — End: 2015-10-08
  Administered 2015-10-06 – 2015-10-08 (×4): 325 mg via ORAL

## 2015-10-05 MED ORDER — zolpidem (AMBIEN) tablet 10 mg
5 | Freq: Every evening | ORAL | Status: DC | PRN
Start: 2015-10-05 — End: 2015-10-08

## 2015-10-05 MED ORDER — hydrOXYzine pamoate (VISTARIL) capsule 50 mg
50 | ORAL | Status: DC | PRN
Start: 2015-10-05 — End: 2015-10-08
  Administered 2015-10-06 – 2015-10-08 (×3): 50 mg via ORAL

## 2015-10-05 MED ORDER — nicotine (NICODERM CQ) 14 mg/24 hr patch 1 patch
14 | Freq: Every day | TRANSDERMAL | Status: DC | PRN
Start: 2015-10-05 — End: 2015-10-08

## 2015-10-05 MED ORDER — haloperidol lactate (HALDOL) injection 5-10 mg
5 | INTRAMUSCULAR | Status: DC | PRN
Start: 2015-10-05 — End: 2015-10-08

## 2015-10-05 MED ORDER — LORazepam (ATIVAN) tablet 1-2 mg
1 | ORAL | Status: DC | PRN
Start: 2015-10-05 — End: 2015-10-08
  Administered 2015-10-07: 21:00:00 1 mg via ORAL

## 2015-10-05 MED ORDER — LORazepam (ATIVAN) injection 1-2 mg
2 | INTRAMUSCULAR | Status: DC | PRN
Start: 2015-10-05 — End: 2015-10-08

## 2015-10-05 MED ORDER — traZODone (DESYREL) tablet 50 mg
50 | Freq: Every evening | ORAL | Status: DC | PRN
Start: 2015-10-05 — End: 2015-10-08
  Administered 2015-10-08: 09:00:00 50 mg via ORAL

## 2015-10-05 MED ORDER — magnesium hydroxide (MILK OF MAGNESIA) 400 mg/5 mL oral suspension 30 mL
400 | Freq: Every day | ORAL | Status: DC | PRN
Start: 2015-10-05 — End: 2015-10-08

## 2015-10-05 MED ORDER — risperiDONE (RisperDAL M-TABS) disintegrating tablet 1-2 mg
1 | ORAL | Status: DC | PRN
Start: 2015-10-05 — End: 2015-10-08

## 2015-10-05 MED ORDER — diphenhydrAMINE (BENADRYL) injection 50 mg
50 | Freq: Four times a day (QID) | INTRAMUSCULAR | Status: DC | PRN
Start: 2015-10-05 — End: 2015-10-08

## 2015-10-05 MED ORDER — ibuprofen (ADVIL,MOTRIN) tablet 400 mg
400 | ORAL | Status: DC | PRN
Start: 2015-10-05 — End: 2015-10-08
  Administered 2015-10-07 – 2015-10-08 (×2): 400 mg via ORAL

## 2015-10-05 MED ORDER — melatonin tablet 6 mg
3 | Freq: Every evening | ORAL | Status: DC | PRN
Start: 2015-10-05 — End: 2015-10-08

## 2015-10-05 MED FILL — HYDROCODONE 10 MG-ACETAMINOPHEN 325 MG TABLET: 10-325 mg | ORAL | Qty: 1

## 2015-10-05 MED FILL — LAMOTRIGINE 100 MG TABLET: 100 mg | ORAL | Qty: 2

## 2015-10-05 MED FILL — SEROQUEL XR 400 MG TABLET,EXTENDED RELEASE: 400 mg | ORAL | Qty: 1

## 2015-10-05 NOTE — ED Notes (Signed)
Ordered breakfast 

## 2015-10-05 NOTE — ED Notes (Signed)
No acute psychiatric beds available in state.  This Clinical research associate contacted [facility] in order to add name to waiting list.

## 2015-10-05 NOTE — ED Notes (Signed)
Check in with BHU charge confirms that there is a bed opening, that pt was just discharged, and room needs to be cleaned.
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
## 2015-10-05 NOTE — Progress Notes (Signed)
Pt was brought to the unit from PCU.  Appeared calm, cooperative and pleasant.  Stated he is been here on the BHU in the past.  Pt's fiancee arrived and sat with pt while he ate lunch.  Pt was shown his room and immediately fell asleep.  Admission is not complete at this time.  No scheduled meds this shift.

## 2015-10-05 NOTE — Progress Notes (Signed)
HOME MEDICATION LIST REVIEWED BY PHARMACIST -  NOT ANTICIPATED TO CAUSE HARM     ? Home medication list in Epic was prepared during this inpatient visit, by a team member other than a pharmacist, in a best attempt to document medications that the patient takes in the outpatient setting    ? The pharmacist has reviewed the list, without direct patient communication, and has not identified any concerns that are anticipated to cause harm in a typical patient     ? Inaccuracies may still exist, and the home medication list has not been clinically evaluated for appropriate indications; please use clinical judgment when reconciling the list       Signed by: Eileen Kangas T. Kanae Ignatowski, RPH

## 2015-10-06 DIAGNOSIS — F319 Bipolar disorder, unspecified: Secondary | ICD-10-CM

## 2015-10-06 LAB — TREPONEMA TOTAL ANTIBODY FOR SYPHILIS SCREENING
Treponema Antibody Index: <0.1 IV (ref <0.9)
Treponema Antibody: NEGATIVE

## 2015-10-06 MED ORDER — meloxicam (MOBIC) tablet 15 mg
7.5 | Freq: Every day | ORAL | Status: DC
Start: 2015-10-06 — End: 2015-10-08
  Administered 2015-10-06 – 2015-10-08 (×3): 7.5 mg via ORAL

## 2015-10-06 MED FILL — HYDROCODONE 10 MG-ACETAMINOPHEN 325 MG TABLET: 10-325 mg | ORAL | Qty: 1

## 2015-10-06 MED FILL — LAMOTRIGINE 100 MG TABLET: 100 mg | ORAL | Qty: 2

## 2015-10-06 MED FILL — SEROQUEL XR 200 MG TABLET,EXTENDED RELEASE: 200 mg | ORAL | Qty: 2

## 2015-10-06 MED FILL — ACETAMINOPHEN 325 MG TABLET: 325 mg | ORAL | Qty: 1

## 2015-10-06 MED FILL — MELOXICAM 7.5 MG TABLET: 7.5 mg | ORAL | Qty: 2

## 2015-10-06 MED FILL — HYDROXYZINE PAMOATE 50 MG CAPSULE: 50 mg | ORAL | Qty: 1

## 2015-10-06 NOTE — Progress Notes (Signed)
Pt is A/O, denies SI/HI/AVH. Pt describes his mood as on edge. He states that he doesn't remember 4-5 days and he hopes that he hasn't hurt anyone and that his family will still love him. He complains of generalized pain and denies pain in his foot saying, everyone keeps asking me about my foot but i have no pain there. Pt cooperative and appreciative of cares.

## 2015-10-06 NOTE — Progress Notes (Signed)
Normal affect, anxious mood. Denies SI/HI. No hallucinations or delusions. Spoke at length with PA about medication and pain management which eased his anxiety. Chronic back and arm pain, received Norco at 1620 and Tylenol and Ibuprofen at 1950. Pleasant and cooperative with scheduled meds.

## 2015-10-06 NOTE — Progress Notes (Signed)
Slept 6.5 hours unbroken.  Awake at 0515 complaining of 8/10 generalized body aches as well as anxiety. Also complained an ache in his left ear.  Given a Norco and 50 mg of vistaril at 0530.  Alert and oriented x 4.  Good eye contact.  Stated that he does not remember much of the last couple days and is worried about what has happened and how he has behaved.  Cooperative and appreciative.  BP was taken at 2300, 144/60 HR 93 and temp down to 36.9.  This am BP 133/72,  HR 102,  temp 36.1.
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
## 2015-10-06 NOTE — H&P (Signed)
BHU Admission History and Physical   Name: Richard Harrison  DOB: 1971-07-02 44 y.o.  MRN: [id number]  CSN: [id number]    Identification:  44 y.o. male veteran with a history of PTSD, anxiety, bipolar disorder was admitted on 10/04/2015 for acute psychotic mania.    Chief Complaint:  I've been begging for help for the PTSD but all people want to treat is the bipolar disorder. I just wish that I could talk to somebody for 3 hours a day, and they could tell me it's okay, I'm not crazy.    History of Present Illness:  Patient presented to the emergency department on 10/06/15 with family, after being released from jail (an overnight stay for trespassing; just prior to that, patient had apparently been at [facility], acutely manic). Yesterday, patient was completely manic, nonsensical. He slept well overnight, and is able to give a far more cogent history today.     Patient states that he has no recollection whatsoever of events from Sunday, 09/30/15, until the present. He states that he lost 5 days of his life and the family has been telling him you don't want to know what you did. States that his fianc?e spoke with him yesterday, and that there were 3 different voices telling her things and denying that he was himself. Patient is understandably spooked. He reports pain at multiple body sites, but does not recall cause. He notes ligature marks on wrists, presumably from handcuffs.    Patient not demonstrating euphoria or grandiosity, but has significant pressured speech, and gives circular history. Apparently, he was a long-term patient of Dr. Fabiola Backer, former Medical Dir. of this unit, and had been told that were Dr. Vear Clock to write a book on bipolar disorder, the patient would be on the cover. Patient gives history of impulsive shopping sprees, euphoria during manic episodes, grandiosity, paranoid thinking, psychosis, sleepless periods, talking too much, etc. In other words, the textbook description  of history of bipolar disorder. S    tates that he was hospitalized at [facility], and [place], in 2014, and that the regimen that they had placed him on there was the most effective. He does not recall what that regimen was. He states that he went into a deep depression at some point later, x 3 mos. and then appealed for help from Dr. Elmarie Shiley, his provider at the [facility]. Recently he has been seeing Berna Spare, PMHNP; she has been hoping to start him on a mood stabilizer but is worried about side effects.    Patient states that he is amenable to another trial of lithium.    Past Psychiatric History:  Diagnostic History: diagnosed with bipolar disorder in 2004 at age 27, after manic episode apparently precipitated by the murder of his sister.  Hospitalizations: multiple previous hospitalizations, including [facility] in 2004, and [facility] [place] [facility] in February 2014.  Suicide Attempts/Self-Harm: denies previous suicide attempts.  Medication Trials: lithium (emotional flattening, somatic discomfort; largest dose was 1800 mg daily). Trazodone. Lamotrigine. Quetiapine. Other medications he does not recall. Trileptal ('didn't work.) Risperdal.   Psychiatrist: Dr. Elmarie Shiley @ [facility]; Leodis Binet PMHNP  Substance use: patient states that his pain in sleep are both improved by one hit of weed nightly, but otherwise states that he never has never will use other illicit substances. Denies issues of alcohol.    Past Medical History:  Past Medical History   Diagnosis Date   . Bipolar affective disorder    .  Hypertension    . Kidney stone    possible chronic back pain.  History of arthroscopic surgery, left knee, 2016    Allergies:  Penicillins    Current Medications:  Prior to Admission Medications    Medication Dose & Frequency   lamoTRIgine (LAMICTAL) 100 MG tablet Take 200 mg by mouth nightly.    QUEtiapine (SEROQUEL XR) 400 MG 24 hr tablet Take 400 mg by mouth nightly.      traZODone (DESYREL) 100 MG tablet Take 100 mg by mouth nightly.   HYDROcodone-acetaminophen (NORCO) 10-325 mg per tablet Take 1 tablet by mouth every 6 (six) hours as needed for Pain.   QUEtiapine (SEROQUEL) 200 MG tablet Take one to two additional tabs as needed for insomnia.        Social History:  Social History   Substance Use Topics   . Smoking status: Never Smoker   . Smokeless tobacco: Never Used   . Alcohol use 0.6 - 2.4 oz/week     1 - 4 Cans of beer per week      Comment: 2-3 X/mo   difficult to obtain full social history due to patient circumstantiality. 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 Lives with his fianc?e. At some point had been living in a warehouse, relatively comfortable and sleeping well there. Armed services history was not discussed.    Family History:  Denies history of familial mental illness.    Mental Status Evaluation:  Appearance / Behavior: Well developed, overweight man with short black hair, stubble, wearing sweat pants and a [employer] hoodie. Appearing about stated age. Adequately groomed. Talkative.   Eye Contact: Good  Attention/Concentration: Intact and appropriate.  Speech: Pressured but normal rate, tone and volume.  Motor: No evidence of tremors or dystonia. No psychomotor agitation or retardation. Normal gait.  Mood: Scared.  Affect: Congruent, full range and appropriate.  Thought Process/Associations: Logical, but circumstantial and circular  Thought Content: No overt delusions or paranoia voiced  Self-Harm: denies suicidal ideation  Threat to others: denies homicidal ideation, and no clear intent  Perceptual Phenomena: Denies any auditory or visual hallucinations.   Orientation: Oriented x3  Memory: Recent and remote grossly intact.  Fund of knowledge / Language: Grossly intact.   Insight: Fair  Judgement: Fair  Impulse Control: Fair  Sensorium: intact, does not fluctuate.    Recent Labs:  No results found for this visit on 10/04/15 (from the past 24 hour(s)).  Pending  Labs     Order Current Status    Light Green Top (Short) -Once In process    Rainbow Draw [facility]) -STAT In process    Treponema Total Antibody for Syphilis screening -AM Draw In process    Culture, Urine -Next Routine Preliminary result        Recent imaging:  No results found.    Physical Examination:  Pt was examined in the ED by Dr. Chales Salmon. There is no difference in presentation today less than 24 hours later, and the exam will not be repeated here.      Review of Systems:  GEN: denies recent weight change, fevers, chills, night sweats, weakness  HEENT: denies change to vision, taste, smell; denies ear congestion and fullness; denies sore throat   PULM: denies coughing, wheezing, shortness of breath, sputum   CV: denies chest pain, pressure, palpitations, peripheral edema  GI: denies nausea, vomiting, abdominal pain, constipation, loose stools, bleeds   GU: denies dark or tea colored urine, denies hesitancy, dysuria or retention  MSK:  Endorses back pain, medial right forearm pain consistent with description of medial epicondylitis, and knee pain. Denies other new onset joint, muscle pain, fracture, weakness.  Integument: Avulsion injury of left big toenail, without laceration. Otherwise denies rash, lesions, pruritus   Neuro: denies syncope, denies headache, denies focal weakness or parasthesias, ataxia or incontinence  Endocrine: denies sweating, cold or heat intolerance, hair loss/gain, polyuria or polydipsia  Lymphatic: denies enlarged nodes, denies splenectomy  Psych: See HPI   Allergies:  Drug/food allergies as noted in chart, denies seasonal allergies      Impression:  85 old man with history of type I bipolar disorder, was floridly psychotic and manic yesterday, consistent with mania with psychotic features. Today however he is far more organized, nonpsychotic, in regards his recent history with frank bewilderment, almost as though he had been in a dissociative fugue.     The speed of reconstitution  suggests possible brief psychotic disorder, possible substance induced psychosis, based purely on change in presentation today, although there are no other factors and no history to support that his psychosis could've been substance-induced. Organic factors are yet to be ruled out. Patient does endorse significant anxiety, nightmares, and exaggerated startle response, suggesting very active PTSD. He has no tearfulness or other signs of seriously impoverished mood; clinical picture does not correlate to mixed bipolar state.    Plan is to stabilize mood, and treat with medication to help address underlying anxiety and PTSD. Based on current presentation, anticipate a possibly short stay.    Diagnoses:  Axis I-III:  Problem List     Problem Entered Resolved Chronic    Mania 10/05/2015      Psychotic episode 10/05/2015      Nail avulsion, toe, initial encounter 10/05/2015      Closed nondisplaced fracture of distal phalanx of left great toe, initial encounter 10/05/2015      Severe manic bipolar 1 disorder with psychotic behavior 09/17/2015      Kidney stone 03/24/2014      Biliary or urinary stones 03/24/2014      Flank pain 03/24/2014      Kidney stone on left side 03/24/2014      Urinary hesitancy 03/24/2014          Axis IV: Chronic and persistent mental illness, but has loving partner, good support system  Axis V: Global Assessment of Functioning (GAF) of 35-40.    Plan:  1. Admit the patient to the inpatient psychiatric unit for stabilization and to provide for patient safety. Patient will need more than 2 midnights of inpatient care for evaluation and treatment. Estimated length of stay is 3-5 days.  2. Bipolar disorder: Unstable. Continue quetiapine 400 mg nightly. Begin lithium 300 mg twice daily.  3. If lithium is well-tolerated: Consider addition of Depakote. Patient did not stabilize/could not tolerate high-dose lithium monotherapy, but may benefit from lithium augmented by Depakote, with a conventional SSRI or SSRI to  help address refractory anxiety.  4. Consider adjunct treatment with Zyprexa 5 mg nightly for depression, if SSRI could not be tolerated.  5. Chronic nightmares associated with PTSD: Unstable, begin prazosin 1 mg daily.  6. Obtain additional records from Dr. Elmarie Shiley at [facility]  7. Consider restarting patient on treatment regimen was given at with [facility], in [place], [place], 2014  8. Encourage group discussion staff discretion.  9. Disposition: Pending    Darrell Jewel  10/06/2015  2:38 PM     This note  was produced using voice dictation software and despite proofreading is vulnerable to transcription errors, homonyms, etc. that may create unintended meaning. Please contact me if there is need for clarification.

## 2015-10-07 MED ORDER — prazosin (MINIPRESS) capsule 1 mg
1 | Freq: Every evening | ORAL | Status: DC
Start: 2015-10-07 — End: 2015-10-07
  Administered 2015-10-07: 04:00:00 1 mg via ORAL

## 2015-10-07 MED ORDER — prazosin (MINIPRESS) capsule 3 mg
1 | Freq: Every evening | ORAL | Status: DC
Start: 2015-10-07 — End: 2015-10-08
  Administered 2015-10-08: 04:00:00 1 mg via ORAL

## 2015-10-07 MED ORDER — divalproex ER (DEPAKOTE ER) 24 hr tablet 250 mg
250 | Freq: Two times a day (BID) | ORAL | Status: DC
Start: 2015-10-07 — End: 2015-10-08
  Administered 2015-10-07 – 2015-10-08 (×3): 250 mg via ORAL

## 2015-10-07 MED ORDER — lamoTRIgine (LaMICtal) tablet 150 mg
100 | Freq: Every evening | ORAL | Status: DC
Start: 2015-10-07 — End: 2015-10-08
  Administered 2015-10-08: 04:00:00 100 mg via ORAL

## 2015-10-07 MED ORDER — lithium (LITHOBID) CR tablet 300 mg
300 | Freq: Two times a day (BID) | ORAL | Status: DC
Start: 2015-10-07 — End: 2015-10-08
  Administered 2015-10-07 – 2015-10-08 (×4): 300 mg via ORAL

## 2015-10-07 MED FILL — PRAZOSIN 1 MG CAPSULE: 1 mg | ORAL | Qty: 1

## 2015-10-07 MED FILL — ACETAMINOPHEN 325 MG TABLET: 325 mg | ORAL | Qty: 2

## 2015-10-07 MED FILL — LITHIUM CARBONATE ER 300 MG TABLET,EXTENDED RELEASE: 300 mg | ORAL | Qty: 1

## 2015-10-07 MED FILL — DIVALPROEX ER 250 MG TABLET,EXTENDED RELEASE 24 HR: 250 mg | ORAL | Qty: 1

## 2015-10-07 MED FILL — HYDROXYZINE PAMOATE 50 MG CAPSULE: 50 mg | ORAL | Qty: 1

## 2015-10-07 MED FILL — MELOXICAM 7.5 MG TABLET: 7.5 mg | ORAL | Qty: 2

## 2015-10-07 MED FILL — IBUPROFEN 400 MG TABLET: 400 mg | ORAL | Qty: 1

## 2015-10-07 MED FILL — HYDROCODONE 10 MG-ACETAMINOPHEN 325 MG TABLET: 10-325 mg | ORAL | Qty: 1

## 2015-10-07 MED FILL — LORAZEPAM 1 MG TABLET: 1 mg | ORAL | Qty: 2

## 2015-10-07 NOTE — Progress Notes (Signed)
Daily Progress Note     Name: Richard Harrison  DOB: 07/13/1971 44 y.o.  MRN: [id number]  CSN: [id number]    Subjective:     The patient was seen, and progress was reviewed with staff. Per Staff Normal affect, anxious mood. Denies SI/HI. No hallucinations or delusions. Spoke at length with PA about medication and pain management which eased his anxiety. Chronic back and arm pain, received Norco at 1620 and Tylenol and Ibuprofen at 1950. Pleasant and cooperative with scheduled meds.    Patient was seen and interviewed with Richard Frame PA-C and myself. He reports that he feels on edge and has began to startle easily; doors slamming, people walking behind him, tapping on the shoulder all seem to be triggers. He reports poor sleep due to a multitude of factors; roommate, terrible mattress, and intrusive nightmares that lead him to wake up feeling extremely anxious. Patient reports that physically he is feeling much better in terms of his generalized MSK complaints and that Mobic is beneficial however, does state mentally he is feeling worse. He states he is scared shitless that he can't remember what transpired prior to admit.  We discussed that wellness is a process and we will be addressing both the bipolar and his PTSD. Discussed Depakote as an adjunct with lithium and patient was onboard with this plan.  He denies SI/HI, and AVH.       He does have complaints of frequent urination and increased thirst. He denies any GI upset, HA, and dizziness.    Scheduled Medications:   . divalproex ER  250 mg Oral BID   . lamoTRIgine  150 mg Oral Nightly   . lithium  300 mg Oral 2 times per day   . meloxicam  15 mg Oral Daily   . prazosin  3 mg Oral Nightly   . QUEtiapine  400 mg Oral Nightly       Objective:     Vital Signs:  BP: 127/77  Pulse: 85  Resp: 16  Temp: 36.3 ?C (97.3 ?F)  SpO2: 97 % (10/16 0853)    Mental Status Examination:  Appearance / Behavior: Well developed, well nourished, and appears stated age.  Adequately groomed.  Eye Contact: Good   Speech: normal rate, volume, tone   Motor: Normokinetic. No signs of tics/tremors. No psychomotor retardation and agitation.    Mood: Physically I'm doing better, but mentally worse   Affect: Fatigued. Restricted range; congruent.   Thought Process: Circular.   Thought Content: No overt signs of paranoia or delusions.   Self-Harm: Denies SI  Harm to others: Denies HI   Visual/Auditory: Denies AVH  Orientation: OX3.   Fund of Knowledge: Grossly intact  Insight: Fair   Judgement: Fair   Impulse Control: Fair           Recent Labs:  No results found for this visit on 10/04/15 (from the past 24 hour(s)).  Pending Labs     Order Current Status    Light Green Top (Short) -Once In process    Rainbow Draw [facility]) -STAT In process    Treponema Total Antibody for Syphilis screening -AM Draw In process        Recent Imaging:  No results found.    Impression   44yo male with hx of trauma, Bipolar Type I, Anxiety, and PTSD who admitted for an acute exacerbation. Patient is unstable, and is exhibiting signs of mania. He has been cooperative with treatment plan and medications. Depakote has  been added in hopes of progressing towards stabilization. Once this occurs, will begin addressing PTSD and discuss possible adjunct with an SSRI ei. Paxil.       Plan:   1. Legal Status: Hospital Hold. Inpatient needed for stabilization and safety  2. Bipolar Type I: Begin Depakote 250mg  BID. Continue Lithium 300mg  BID. Continue Lamictal 150mg   3. Bipolar Type I: Labs; Lithium level 10/09/15  4. PTSD: Nightmares; Increase prazosin to 2mg  QD. Will monitor for signs of hypotension.   5. Back Pain/shoulder pain: Continue Mobic 15mg  QD, Continue Norco Q6 PRN   6. Continue taking all other medications as prescribed   7. Complaints of polyuria/polydipsia: Lab draw Glyco-Hemoglobin A1C   8. Encourage group participation and 1:1 counseling.   9. Disposition: Pending     Jacques Navy  10/07/2015  12:17 PM

## 2015-10-07 NOTE — Progress Notes (Signed)
Daily Progress Note   Name: Richard Harrison  DOB: March 15, 1971 44 y.o.  MRN: [id number]  CSN: [id number]    Identifying Information:   44 y.o. male veteran with a history of PTSD, anxiety, bipolar disorder was admitted on 10/04/2015 for acute psychotic mania.    Subjective:   The patient was seen, and progress was reviewed with staff. Per staff report, pt had a Normal affect, anxious mood. Denies SI/HI. No hallucinations or delusions. Spoke at length with PA about medication and pain management which eased his anxiety. Chronic back and arm pain, received Norco at 1620 and Tylenol and Ibuprofen at 1950. Pleasant and cooperative with scheduled meds.    Patient was seen and interviewed with Jacques Navy PA-S and myself. Pt reports feeling on edge and startling easily; doors slamming, people walking behind him, tapping on the shoulder all seem to be triggers. States that he woke up at 3 in the morning due to nightmares, later went to sleep on a couch in the group room, and inferior well. Did not recall taking medications this morning, but review of MAR finds that he took lithium and 820. No reported side effects from the medication. Amenable to medication adjustments today.     Reported poor sleep due to a multitude of factors; roommate, terrible mattress... You can feel the metal bar. Nonspecific nightmares that lead him to wake up feeling extremely anxious.     Patient reports that physically he is feeling much better in terms of his generalized MSK complaints and that Mobic is beneficial however, does state mentally he is feeling worse. He states he is scared shitless that he can't remember what transpired prior to admit. We discussed that wellness is a process and we will be addressing both the bipolar and his PTSD. Discussed Depakote as an adjunct with lithium and patient was onboard with this plan. He denies SI/HI, and AVH.   ?  He does have complaints of frequent urination and increased thirst. He  denies any GI upset, HA, and dizziness.    Past Psychiatric History:  Diagnostic History: diagnosed with bipolar disorder in 2004 at age 89, after manic episode apparently precipitated by the murder of his sister.  Hospitalizations: multiple previous hospitalizations, including [facility] in 2004, and [facility] in February 2014.  Suicide Attempts/Self-Harm: denies previous suicide attempts.  Medication Trials: lithium (emotional flattening, somatic discomfort; largest dose was 1800 mg daily). Trazodone. Lamotrigine. Quetiapine. Other medications he does not recall. Trileptal ('didn't work.) Risperdal.   Psychiatrist: Dr. Elmarie Shiley @ [facility]; Leodis Binet PMHNP  Substance use: patient states that his pain in sleep are both improved by one hit of weed nightly, but otherwise states that he never has never will use other illicit substances. Denies issues of alcohol.    Scheduled Medications:   Scheduled PRN   . divalproex ER  250 mg Oral BID   . lamoTRIgine  150 mg Oral Nightly   . lithium  300 mg Oral 2 times per day   . meloxicam  15 mg Oral Daily   . prazosin  3 mg Oral Nightly   . QUEtiapine  400 mg Oral Nightly    acetaminophen, aluminum-magnesium hydroxide-simethicone, haloperidol lactate **AND** diphenhydrAMINE, HYDROcodone-acetaminophen, hydrOXYzine pamoate, ibuprofen, LORazepam **OR** LORazepam, magnesium hydroxide, melatonin, nicotine, risperiDONE, traZODone, zolpidem       Psych PRNS received yesterday: None   Objective:     Vitals Current 24 Hour Min / Max      Temp  36.3 ?C (97.3 ?F)    Temp  Min: 36.3 ?C (97.3 ?F)  Max: 36.7 ?C (98.1 ?F)      BP     127/77     BP  Min: 127/77  Max: 156/88      HR    85    Pulse  Min: 85  Max: 98      RR    16    Resp  Min: 16  Max: 16      Sats      SpO2: 97 % on  L/min       SpO2  Min: 97 %  Max: 100 %      Weight    100.4 kg (221 lb 5 oz)  Body mass index is 33.65 kg/(m^2).    Admit: 100.4 kg (221 lb 5 oz)   No intake or output data in the 24 hours  ending 10/07/15 1419  Sleep         Most Recent Value    Sleep Pattern  Disturbed/interrupted sleep @ 10/07/2015 0600    Average Number of Sleep Hours  4 B @ 10/07/2015 0600    Nightmares  Yes @ 10/07/2015 0400          Mental Status Examination:  Appearance / Behavior: Well developed, overweight man with short black hair, stubble, wearing sweat pants and a [employer] hoodie. Appearing about stated age. Adequately groomed. Very talkative. ?  Eye Contact: Good  Attention/Concentration: Intact and appropriate.  Speech: Pressured but normal rate, tone and volume.  Motor: No evidence of tremors or dystonia. No psychomotor agitation or retardation. Normal gait.  Mood: Mentally not so good.  Affect: Pensive; full range and appropriate.  Thought Process/Associations: Logical, but circumstantial and circular  Thought Content: No overt delusions or paranoia voiced  Self-Harm: denies suicidal ideation  Threat to others: denies homicidal ideation, and no clear intent  Perceptual Phenomena: Denies any auditory or visual hallucinations. ?  Orientation: Oriented x3  Memory: Recent and remote grossly intact.  Fund of knowledge / Language: Grossly intact. ?  Insight: Fair  Judgement: Fair  Impulse Control: Fair  Sensorium: intact, does not fluctuate.      Recent Labs:  No results found for this visit on 10/04/15 (from the past 24 hour(s)).  Pending Labs     Order Current Status    Light Green Top (Short) -Once In process    Rainbow Draw [facility]) -STAT In process    Treponema Total Antibody for Syphilis screening -AM Draw In process        Recent Imaging:  No results found.    Impression   44 y.o. male veteran history of type I bipolar disorder, who continues to show signs of mixed hypomania, with PTSD and nightmares. Overall, patient is reconstituting relatively quickly, but clearly will require long-term care. The outpatient picture is hazy from the weekend perspective, and considering recent hospitalizations, I'm not inclined  toward premature discharge. Plan is to stabilize mood, and treat with SSRI after mood stabilization is at therapeutic level, to help address underlying anxiety and PTSD. Based on current presentation, anticipate a possibly short stay.    Review of chart finds that patient's regimen from [place], consisted of Risperdal 3 mg nightly, Trileptal 600 mg twice daily, and trazodone. It is uncertain just how stable and affected this regimen was, patient states this was the best he's felt in years, after leaving that facility in 2014. Yesterday, he commented that Trileptal was  ineffective.    Plan:   1. Admission status: Hospital hold. Inpatient admission remains necessary for patient stabilization and safety. He is at high risk of decompensation if prematurely discharged.  2. Bipolar disorder, hypomania/mixed? Continue lithium 300 mg twice daily, add Depakote 250 mg twice daily.  3. Labs: Lithium level, Depakote level, Tuesday morning.  4. When stable, consider trial of Effexor versus Lexapro., for anxiety, PTSD + Anxiety; alternatively, consider addition of Risperdal, which apparently was effective and well tolerated in the past.  5. Back pain: To continue meloxicam 15 mg daily, continue K pad ad lib., continue norco is necessary, monitor no code usage.  6. Chronic nightmares associated with PTSD: Increase prazosin to 3 mg nightly.  7. Polyuria workup: Adding HgbA1c to labs, next Tuesday.  8. Encourage group participation.  9. Obtain collateral history from family, pursue records from Dr. Elmarie Shiley [facility].  10. Disposition: Pending    Darrell Jewel  10/07/2015  2:19 PM    This note was produced using voice dictation software and despite proofreading is vulnerable to transcription errors, homonyms, etc. that may create unintended meaning. Please contact me if there is need for clarification.

## 2015-10-07 NOTE — Progress Notes (Addendum)
Was awakened by a nightmare and woke up  as I always do, scared and sweating.   Came up to the day area at 0315 and felt reassured to be around staff.  Appeared sleepy, oriented x 4, feeling anxious and appreciative of care.  Given vistaril 50 mg at 0332.  Slept 4 hours broken.  Given 650 mg of tylenol at 0616 for left ear pain.  Has an occasional non productive cough.
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
## 2015-10-07 NOTE — Progress Notes (Signed)
Denies SI/HI. Reports feeling pretty good and anxiety melting away due to medications and a visit with fiance today. Expressed hope for the future. Wants to leave because this place holds bad memories, however, pt is cooperative with scheduled meds and agrees to participate in groups and activities while here. Had 7/10 back pain and received Norco at 1620 with relief.

## 2015-10-08 MED ORDER — lithium (LITHOBID) 300 MG CR tablet
300 | ORAL_TABLET | Freq: Two times a day (BID) | ORAL | 0 refills | 14.00000 days | Status: DC
Start: 2015-10-08 — End: 2023-06-22

## 2015-10-08 MED ORDER — meloxicam (MOBIC) 15 MG tablet
15 | ORAL_TABLET | ORAL | 0 refills | 30.00000 days | Status: DC
Start: 2015-10-08 — End: 2023-06-22

## 2015-10-08 MED ORDER — lamoTRIgine (LAMICTAL) 150 MG tablet
150 | ORAL_TABLET | Freq: Every evening | ORAL | 0 refills | 87.00000 days | Status: AC
Start: 2015-10-08 — End: 2023-07-03

## 2015-10-08 MED ORDER — prazosin (MINIPRESS) 1 MG capsule
1 | ORAL_CAPSULE | Freq: Every evening | ORAL | 0 refills | Status: AC
Start: 2015-10-08 — End: 2023-07-03

## 2015-10-08 MED FILL — SEROQUEL XR 200 MG TABLET,EXTENDED RELEASE: 200 mg | ORAL | Qty: 2

## 2015-10-08 MED FILL — MAG-AL PLUS 200 MG-200 MG-20 MG/5 ML ORAL SUSPENSION: 200-200-20 | ORAL | Qty: 30

## 2015-10-08 MED FILL — DIVALPROEX ER 250 MG TABLET,EXTENDED RELEASE 24 HR: 250 mg | ORAL | Qty: 1

## 2015-10-08 MED FILL — LAMOTRIGINE 25 MG TABLET: 25 mg | ORAL | Qty: 2

## 2015-10-08 MED FILL — LITHIUM CARBONATE ER 300 MG TABLET,EXTENDED RELEASE: 300 mg | ORAL | Qty: 1

## 2015-10-08 MED FILL — HYDROCODONE 10 MG-ACETAMINOPHEN 325 MG TABLET: 10-325 mg | ORAL | Qty: 1

## 2015-10-08 MED FILL — MELOXICAM 7.5 MG TABLET: 7.5 mg | ORAL | Qty: 2

## 2015-10-08 MED FILL — IBUPROFEN 400 MG TABLET: 400 mg | ORAL | Qty: 1

## 2015-10-08 MED FILL — ACETAMINOPHEN 325 MG TABLET: 325 mg | ORAL | Qty: 2

## 2015-10-08 MED FILL — PRAZOSIN 1 MG CAPSULE: 1 mg | ORAL | Qty: 3

## 2015-10-08 MED FILL — TRAZODONE 50 MG TABLET: 50 mg | ORAL | Qty: 1

## 2015-10-08 MED FILL — HYDROXYZINE PAMOATE 50 MG CAPSULE: 50 mg | ORAL | Qty: 1

## 2015-10-08 MED FILL — LAMOTRIGINE 100 MG TABLET: 100 mg | ORAL | Qty: 1

## 2015-10-08 NOTE — Progress Notes (Signed)
Pt discharged in care of his fiancee. They are going directly to [facility] to get his prescriptions filled. We reviewed the AVS together. Pt is comfortable with the discharge plan. He has all his belongings from room and locker. He knows that if he has another manic episode that he can return to the ED.

## 2015-10-08 NOTE — Other (Signed)
[id number]  Richard Harrison  [birth year]    IN THE CIRCUIT COURT OF THE STATE OF [place] FOR [place]    In the Matter of  [name]  Alleged to be a mentally ill person NOTICE OF RELEASE FROM HOSPITAL DETENTION BY A PHYSICIAN OR NURSE PRACTITIONER    Case#: _______________________     TO THE JUDGE OF THE ABOVE COURT:   Jodie Echevaria, the responsible physician, or a nurse practitioner certified under ORS 678.375 and 678.390, hereby give notice, as required by ORS 426.234(2)(c), that on 10/08/2015, I determine that Richard Harrison, a person who was admitted to my care at [facility] under the provisions of ORS 426.232 on 10/04/2015 (enter date Notice of Mental Illness was signed), no longer was dangerous to self or others and no longer was in need of emergency care or treatment for mental illness and therefore I released the above-named person from detention as provided by ORS 426.234(2)(c).     Moosa Bueche Anthonette Legato    10/08/2015 12:06 PM    Electronically signed by:  Chriss Driver
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
## 2015-10-08 NOTE — Discharge Instructions (Signed)
Discharge Plan         Most Recent Value    Discharge Plan     Address at Discharge  [address] [place], [place]      Discharge Status  Release of Detention    Phone Number  [phone]    Medical Follow-up  [facility]:  [address], [place], [place]  Phone:  [phone].  APPOINTMENT:  October 22, 2015 at 9:20 AM with Dr. Margretta Ditty.    Mental Health Follow-up  Berna Spare, PMHNP:  [address], [place], [place]  Phone:  [phone].    APPOINTMENT:  October 30, 2015 at 4:15 PM.                       Other Agencies  [facility]:  [address] [place], [place]  Phone:  [phone].  Psychiatrist:  Dr. Elmarie Shiley.  Patient plans to meet with the Triage Provider-on-Duty upon discharge to request for his medications to be filled.      Living Arrangement  Lives with fiancee.  Plans to stay in a motel for a couple of nights prior to returning home.      Finances  SSDI, Medicare    Social Support: Family & Friends  FianceeLeanora Cover [phone]    Other Recommendations  [facility]:  [address], [place], [place]  Phone:  [phone].  Crisis services are available if needed.      Next level provider has access to pt's record via Epic  Yes        '

## 2015-10-08 NOTE — Progress Notes (Signed)
NOC---Initially asleep on couch in group room stating he was told that he could.  We got him out per policy and he c/o nightmares.  At 0210 was given Vistaril 50 mg, Trazodone 50 mg and Norco 5 mg for neck pain and insomnia, but only slept 3 broken hours all night. Agrees to come to staff if feeling unsafe. No immediate SI per patient report.

## 2015-10-08 NOTE — Discharge Summary (Signed)
[facility] Discharge Summary    Name: Richard Harrison  DOB: March 19, 1971  MRN: [id number]  CSN: [id number]      Identification:  44 year old male veteran with history of PTSD, anxiety, bipolar disorder was admitted on 10/04/15 for acute psychotic mania.    Hospital Course:  Patient had presented to the emergency department on 10/04/15, roughly a week and a half after similar presentation to the emergency department (10/4) at which time patient was also acutely manic; however, during previous presentation and this admission, patient's course is remarkable for arriving at the hospital in a palpably manic state--and reconstituting very quickly with treatment. On 10/4, patient's admission was deferred after he cleared remarkably on 10/5, after 20 hours of sleep while compliant with home doses of Seroquel. There was a similar outcome during this admission, but afterward we held on the patient for a few more days, to help treat his PTSD (he has strong triggers, increased startle reflex, nightmares) anxiety, which he feels are inadequately addressed. In particular, patient was advised that he could possibly, possibly have a trial of an SSRI, provided that he was on a mood stabilizer. He had failed to stabilize on a previous trial of lithium, despite high doses, and was worried about Depakote because of weight gain; during this admission we advised that he resume lithium, and take a small augmenting dose of Depakote, with the idea that later, working with his outpatient provider, he could try a small dose of for example Lexapro and see if his anxiety decreased overall.    Due to starting the Depakote, which could possibly increase serum levels of Lamictal, we decreased Lamictal to 150 mg daily down from 200. At time of discharge, Depakote was accidentally not prescribed, and error that was found only after patient had left the premises; his contact number on file was called, as was his emergency contact number, with request that he  return to the hospital or notify us where we could prescribe his Depakote, and a paper prescription was printed and left with the unit secretary should he return. Patient was discharged on my Friday and so whether or not he ever managed to pick up this prescription, is unknown to me; perform it's worth, he tolerated both Depakote and lithium during the 2 days he was here, and as before he remained clear and stable, if shaken by his recent decompensations.    Discharge Medications:     Patient's Discharge Medication List      TAKE these medications       Indications of Use    HYDROcodone-acetaminophen 10-325 mg per tablet   Quantity:  10 tablet   Commonly known as:  NORCO   Take 1 tablet by mouth every 6 (six) hours as needed for Pain.     pain     lamoTRIgine 150 MG tablet   Quantity:  30 tablet   What changed:    - medication strength  - how much to take   Commonly known as:  LaMICtal   Take 1 tablet (150 mg total) by mouth nightly for 30 days.    Bipolar Disorder in Remission       lithium 300 MG CR tablet   Quantity:  60 tablet   Commonly known as:  LITHOBID   Take 1 tablet (300 mg total) by mouth every 12 (twelve) hours for 30 days.    Bipolar Disorder       meloxicam 15 MG tablet   Quantity:  15 tablet  Commonly known as:  MOBIC   No more than once daily by mouth for pain,    Back Pain and Elbow Pain       prazosin 1 MG capsule   Quantity:  90 capsule   Commonly known as:  MINIPRESS   Take 3 capsules (3 mg total) by mouth nightly for 30 days.    Chronic PTSD with Trauma Nightmares       QUEtiapine 400 MG 24 hr tablet   What changed:  Another medication with the same name was removed. Continue taking this medication, and follow the directions you see here.   Commonly known as:  SEROquel XR   Take 400 mg by mouth nightly.     bipolar disorder     traZODone 100 MG tablet   Commonly known as:  DESYREL   Take 100 mg by mouth nightly.     insomnia           Mental Status Examination At Time of  Discharge:  Appearance / Behavior: Well developed, overweight man with short black hair, stubble, wearing sweat pants and a [employer] hoodie. Appearing about stated age. Adequately groomed. Cooperative. ?  Eye Contact: Good  Attention/Concentration: Intact and appropriate.  Speech: Pressured but normal rate, tone and volume.  Motor: No evidence of tremors or dystonia. No psychomotor agitation or retardation. Normal gait.  Mood: Okay. And I do really need to get out of here, this place. Triggers bad memories.  Affect: Pensive; full range and appropriate.  Thought Process/Associations: Logical, but circumstantial and circular  Thought Content: No overt delusions or paranoia voiced  Self-Harm: denies suicidal ideation  Threat to others: denies homicidal ideation, and no clear intent  Perceptual Phenomena: Denies any auditory or visual hallucinations. ?  Orientation: Oriented x3  Memory: Recent and remote grossly intact.  Fund of knowledge / Language: Grossly intact. ?  Insight: Fair  Judgement: Fair  Impulse Control: Fair  Sensorium: intact, does not fluctuate.    Discharge Diagnoses:  Axis I-Axis III:  Problem List     Problem Entered Resolved Chronic    Chronic post-traumatic stress disorder (PTSD) 10/06/2015  Yes    Nightmares associated with chronic post-traumatic stress disorder 10/06/2015      Bipolar 1 disorder 10/06/2015      Mania 10/05/2015      Psychotic episode 10/05/2015      Nail avulsion, toe, initial encounter 10/05/2015      Closed nondisplaced fracture of distal phalanx of left great toe, initial encounter 10/05/2015      Severe manic bipolar 1 disorder with psychotic behavior 09/17/2015      Kidney stone 03/24/2014      Biliary or urinary stones 03/24/2014      Flank pain 03/24/2014      Kidney stone on left side 03/24/2014      Urinary hesitancy 03/24/2014            Axis IV: Chronic and persistent mental illness, but has loving partner, good support system  Axis V: Discharge Global Assessment of  Functioning (GAF) of 50.    Disposition:  At time of discharge patient was explicitly not a threat to self or others. The pt is able to care for self with the assistance of family. Discharge to home is appropriate.  Medication recommendations: Please read subjective above. I feel the patient would benefit from increased treatment of anxiety and PTSD, both her medication and other modalities. Patient's bipolar disorder may be too sensitive to  SSRIs to tolerate any quantity, but benefits possibly exceed risk especially if mitigated by multiple mood stabilizers.  Patient was also prescribed a short course of mobility, for what sounds like medial epicondylitis and back pain; patient advised to stop taking this medication if experiencing GI upset.  Pt would benefit from regular outpatient counseling.  Patient was advised to refrain from using alcohol, illicit substance, or any other drugs not prescribed as they can worsen psychiatric conditions.   Pt advised to call 911 if concerned for emergent threat to life or limb.   Follow-ups per Discharge Plan below:    Discharge Plan         Most Recent Value    Discharge Plan     Address at Discharge  [address] [place], [place]      Discharge Status  Release of Detention    Phone Number  [phone]    Medical Follow-up  [facility]:  [address] [place], [place]  Phone:  [phone].  APPOINTMENT:  October 22, 2015 at 9:20 AM with Dr. Margretta Ditty.    Mental Health Follow-up  Berna Spare, PMHNP:  [address], [place], [place]  Phone:  [phone].  APPOINTMENT:  October 30, 2015 at 4:15 PM.                       Other Agencies  [facility]:  [address] [place], [place]  Phone:  [phone].  Psychiatrist:  Dr. Elmarie Shiley.  Patient plans to meet with the Triage Provider-on-Duty upon discharge to request for his medications to be filled.      Living Arrangement  Lives with fiancee.  Plans to stay in a motel for a  couple of nights prior to returning home.      Finances  SSDI, Medicare    Social Support: Family & Friends  FianceeLeanora Cover [phone]    Other Recommendations  [facility]:  [address] [place], [place]  Phone:  [phone].  Crisis services are available if needed.      Next level provider has access to pt's record via Epic  Yes        Time:  I have spent a total of 45 minutes preparing this discharge: 20 minutes face to face with the patient, and 25 minutes with the staff, EMR/prescribing, and chart review.    Darrell Jewel  10/08/2015  5:42 PM     This note was produced using voice dictation software and despite proofreading is vulnerable to transcription errors, homonyms, etc. that may create unintended meaning. Please contact me if there is need for clarification.

## 2018-02-13 ENCOUNTER — Other Ambulatory Visit: Admit: 2018-02-13 | Discharge: 2018-02-13 | Payer: MEDICARE

## 2018-02-13 DIAGNOSIS — E8881 Metabolic syndrome: Secondary | ICD-10-CM

## 2018-02-13 LAB — COMPREHENSIVE METABOLIC PANEL
ALT - Alanine Aminotransferase: 35 IU/L (ref 7–52)
AST - Aspartate Aminotransferase: 25 IU/L (ref 10–50)
Albumin/Globulin Ratio: 1.3 (ref >0.9)
Albumin: 4.3 g/dL (ref 3.5–5.0)
Alkaline Phosphatase: 69 IU/L (ref 34–104)
Anion Gap: 8 mmol/L (ref 4–13)
BUN: 15 mg/dL (ref 6–23)
Bilirubin Total: 0.4 mg/dL (ref 0.3–1.2)
CO2 - Carbon Dioxide: 27 mmol/L (ref 21–31)
Calcium: 9.9 mg/dL (ref 8.6–10.3)
Chloride: 103 mmol/L (ref 98–111)
Creatinine: 0.83 mg/dL (ref 0.65–1.30)
GFR Estimate: >60 mL/min/{1.73_m2} (ref >=60)
Globulin: 3.3 g/dL (ref 2.2–3.7)
Glucose: 101 mg/dL — ABNORMAL HIGH (ref 80–99)
Potassium: 4.4 mmol/L (ref 3.5–5.1)
Protein Total: 7.6 g/dL (ref 6.0–8.0)
Sodium: 138 mmol/L (ref 135–143)

## 2018-02-13 LAB — CORONARY RISK LIPID PANEL
Cholesterol, HDL: 35 mg/dL — ABNORMAL LOW (ref >=40)
Cholesterol: 228 mg/dL — ABNORMAL HIGH (ref <200)
LDL Calculated: 126 mg/dL — ABNORMAL HIGH (ref <100)
Triglyceride: 337 mg/dL — ABNORMAL HIGH (ref 30–149)

## 2018-02-13 LAB — GLYCO-HEMOGLOBIN A1C
Estimated Average Glucose: 120 mg/dL
Glycohemoglobin (A1c): 5.8 % (ref 4.3–6.1)

## 2018-02-13 LAB — 25-OH HYDROXY VITAMIN D, CALCIFEROL, TOTAL OF D2 & D3: Vitamin D, 25-OH Total: 18.4 ng/mL — ABNORMAL LOW (ref 30.0–100.0)

## 2018-02-13 LAB — INSULIN, TOTAL: Insulin: 39.8 u[IU]/mL — ABNORMAL HIGH (ref 2.0–23.0)

## 2019-01-05 DIAGNOSIS — E039 Hypothyroidism, unspecified: Secondary | ICD-10-CM | POA: Diagnosis not present

## 2019-01-05 LAB — TSH: TSH: 6.35 — AB (ref 0.41–5.90)

## 2019-01-12 ENCOUNTER — Ambulatory Visit (INDEPENDENT_AMBULATORY_CARE_PROVIDER_SITE_OTHER): Payer: BLUE CROSS/BLUE SHIELD | Admitting: Family Medicine

## 2019-01-12 ENCOUNTER — Encounter (INDEPENDENT_AMBULATORY_CARE_PROVIDER_SITE_OTHER): Payer: Self-pay

## 2019-01-12 DIAGNOSIS — Z0289 Encounter for other administrative examinations: Secondary | ICD-10-CM

## 2019-01-17 ENCOUNTER — Ambulatory Visit (INDEPENDENT_AMBULATORY_CARE_PROVIDER_SITE_OTHER): Payer: BLUE CROSS/BLUE SHIELD | Admitting: Bariatrics

## 2019-01-17 ENCOUNTER — Encounter (INDEPENDENT_AMBULATORY_CARE_PROVIDER_SITE_OTHER): Payer: Self-pay | Admitting: Bariatrics

## 2019-01-17 VITALS — BP 129/78 | HR 74 | Temp 97.9°F | Ht 74.0 in | Wt 291.0 lb

## 2019-01-17 DIAGNOSIS — R0602 Shortness of breath: Secondary | ICD-10-CM

## 2019-01-17 DIAGNOSIS — Z6837 Body mass index (BMI) 37.0-37.9, adult: Secondary | ICD-10-CM

## 2019-01-17 DIAGNOSIS — F3289 Other specified depressive episodes: Secondary | ICD-10-CM

## 2019-01-17 DIAGNOSIS — M5489 Other dorsalgia: Secondary | ICD-10-CM | POA: Diagnosis not present

## 2019-01-17 DIAGNOSIS — E038 Other specified hypothyroidism: Secondary | ICD-10-CM | POA: Diagnosis not present

## 2019-01-17 DIAGNOSIS — Z9189 Other specified personal risk factors, not elsewhere classified: Secondary | ICD-10-CM

## 2019-01-17 DIAGNOSIS — E559 Vitamin D deficiency, unspecified: Secondary | ICD-10-CM

## 2019-01-17 DIAGNOSIS — E66812 Obesity, class 2: Secondary | ICD-10-CM

## 2019-01-17 DIAGNOSIS — R5383 Other fatigue: Secondary | ICD-10-CM

## 2019-01-18 ENCOUNTER — Encounter (INDEPENDENT_AMBULATORY_CARE_PROVIDER_SITE_OTHER): Payer: Self-pay

## 2019-01-18 LAB — COMPREHENSIVE METABOLIC PANEL
A/G RATIO: 2.3 — AB (ref 1.2–2.2)
ALK PHOS: 66 IU/L (ref 39–117)
ALT: 19 IU/L (ref 0–44)
AST: 21 IU/L (ref 0–40)
Albumin: 4.6 g/dL (ref 4.0–5.0)
BUN / CREAT RATIO: 16 (ref 9–20)
BUN: 15 mg/dL (ref 6–24)
Bilirubin Total: 0.2 mg/dL (ref 0.0–1.2)
CALCIUM: 9.3 mg/dL (ref 8.7–10.2)
CO2: 21 mmol/L (ref 20–29)
Chloride: 104 mmol/L (ref 96–106)
Creatinine, Ser: 0.96 mg/dL (ref 0.76–1.27)
GFR calc Af Amer: 107 mL/min/{1.73_m2} (ref >59)
GFR, EST NON AFRICAN AMERICAN: 92 mL/min/{1.73_m2} (ref >59)
GLOBULIN, TOTAL: 2 g/dL (ref 1.5–4.5)
Glucose: 93 mg/dL (ref 65–99)
POTASSIUM: 4.8 mmol/L (ref 3.5–5.2)
SODIUM: 140 mmol/L (ref 134–144)
Total Protein: 6.6 g/dL (ref 6.0–8.5)

## 2019-01-18 LAB — INSULIN, RANDOM: INSULIN: 11.2 u[IU]/mL (ref 2.6–24.9)

## 2019-01-18 LAB — HEMOGLOBIN A1C
ESTIMATED AVERAGE GLUCOSE: 108 mg/dL
Hgb A1c MFr Bld: 5.4 % (ref 4.8–5.6)

## 2019-01-18 LAB — VITAMIN D 25 HYDROXY (VIT D DEFICIENCY, FRACTURES): Vit D, 25-Hydroxy: 19.4 ng/mL — ABNORMAL LOW (ref 30.0–100.0)

## 2019-01-18 NOTE — Progress Notes (Signed)
.  Office: [phone]  /  Fax: [phone]   HPI:   Chief Complaint: OBESITY  Gregory Schroeder (MR# [id number]) is a 48 y.o. male who presents on 01/17/2019 for obesity evaluation and treatment. Current BMI is Body mass index is 37.36 kg/m.Gregory Schroeder. Gregory Schroeder has struggled with obesity for years and has been unsuccessful in either losing weight or maintaining long term weight loss. Gregory Schroeder attended our information session and states he is currently in the action stage of change and ready to dedicate time achieving and maintaining a healthier weight.  Gregory Schroeder does enjoy cooking and meal planning Gregory Schroeder states his desired weight loss is 66 lbs he has been heavy most of his life he started gaining weight in college his heaviest weight ever was 355 lbs. he craves salty carbohydrates and chocolate  he snacks frequently in the evenings he skips meals frequently he is frequently drinking liquids with calories he frequently makes poor food choices he frequently eats larger portions than normal  he has binge eating behaviors he struggles with emotional eating    Fatigue Gregory Schroeder feels his energy is lower than it should be. This has worsened with weight gain and has not worsened recently. Gregory Schroeder admits to daytime somnolence and he admits to waking up still tired. Patient is at risk for obstructive sleep apnea. Patent has a history of symptoms of daytime fatigue and morning fatigue. Patient generally gets 7 hours of sleep per night, and states they generally have restful sleep. Snoring is present. Apneic episodes are not present. Epworth Sleepiness Score is 1  Dyspnea on exertion Gregory Schroeder notes increasing shortness of breath with exercising and seems to be worsening over time with weight gain. He notes getting out of breath sooner with certain activities than he used to. This has not gotten worse recently. Gregory Schroeder denies orthopnea.  Back Pain Gregory Schroeder has back pain from "wear and tear" that does not  interfere with activities.  Hypothyroidism Gregory Schroeder has a diagnosis of hypothyroidism. He is taking levothyroxine 250 mcg daily. He denies hot or cold intolerance.  Vitamin D deficiency Gregory Schroeder has a diagnosis of vitamin D deficiency. He is not currently taking vit D and denies nausea, vomiting or muscle weakness.  At risk for osteopenia and osteoporosis Gregory Schroeder is at higher risk of osteopenia and osteoporosis due to vitamin D deficiency.   Depression with emotional eating behaviors Gregory Schroeder is eating at night. He struggles with emotional eating and using food for comfort to the extent that it is negatively impacting his health. He often snacks when he is not hungry. Gregory Schroeder sometimes feels he is out of control and then feels guilty that he made poor food choices. He has been working on behavior modification techniques to help reduce his emotional eating and has been somewhat successful. He shows no sign of suicidal or homicidal ideations.  Depression Screen Gregory Schroeder Food and Mood (modified PHQ-9) score was  Depression screen PHQ 2/9 01/17/2019  Decreased Interest 1  Down, Depressed, Hopeless 1  PHQ - 2 Score 2  Altered sleeping 1  Tired, decreased energy 1  Change in appetite 1  Feeling bad or failure about yourself  1  Trouble concentrating 0  Moving slowly or fidgety/restless 0  Suicidal thoughts 0  PHQ-9 Score 6  Difficult doing work/chores Somewhat difficult    ASSESSMENT AND PLAN:  Other fatigue - Plan: EKG 12-Lead, Hemoglobin A1c, Insulin, random, Comprehensive metabolic panel  Shortness of breath on exertion  Other back pain, unspecified chronicity  Other specified hypothyroidism  Vitamin D deficiency - Plan: VITAMIN D 25 Hydroxy (Vit-D Deficiency, Fractures)  Other depression - with emotional eating  At risk for osteoporosis  Class 2 severe obesity with serious comorbidity and body mass index (BMI) of 37.0 to 37.9 in adult, unspecified obesity type  (HCC)  PLAN:  Fatigue Gregory Schroeder was informed that his fatigue may be related to obesity, depression or many other causes. Labs will be ordered, and in the meanwhile Gregory Schroeder has agreed to work on diet, exercise and weight loss to help with fatigue. Proper sleep hygiene was discussed including the need for 7-8 hours of quality sleep each night. A sleep study was not ordered based on symptoms and Epworth score.  Dyspnea on exertion Gregory Schroeder shortness of breath appears to be obesity related and exercise induced. He has agreed to work on weight loss and gradually increase exercise to treat his exercise induced shortness of breath. If Gregory Schroeder follows our instructions and loses weight without improvement of his shortness of breath, we will plan to refer to pulmonology. We will monitor this condition regularly. Gregory Schroeder agrees to this plan.  Back Pain Gregory Schroeder will do back exercises and will follow up with our clinic in 2 weeks.  Hypothyroidism Gregory Schroeder was informed of the importance of good thyroid control to help with weight loss efforts. He was also informed that supertheraputic thyroid levels are dangerous and will not improve weight loss results. Gregory Schroeder will continue his medications and follow up as directed.  Vitamin D Deficiency Gregory Schroeder was informed that low vitamin D levels contributes to fatigue and are associated with obesity, breast, and colon cancer. We will check vitamin D level today and he will follow up for routine testing of vitamin D, at least 2-3 times per year.   At risk for osteopenia and osteoporosis Gregory Schroeder was given extended  (15 minutes) osteoporosis prevention counseling today. Gregory Schroeder is at risk for osteopenia and osteoporosis due to his vitamin D deficiency. He was encouraged to take his vitamin D and follow his higher calcium diet and increase strengthening exercise to help strengthen his bones and decrease his risk of osteopenia and osteoporosis.  Depression with Emotional  Eating Behaviors We discussed behavior modification techniques today to help Gregory Schroeder deal with his stress eating. We will refer to Dr. Dewaine CongerBarker our bariatric psychologist.  Depression Screen Gregory Schroeder had a mildly positive depression screening. Depression is commonly associated with obesity and often results in emotional eating behaviors. We will monitor this closely and work on CBT to help improve the non-hunger eating patterns.   Obesity Gregory Schroeder is currently in the action stage of change and his goal is to continue with weight loss efforts He has agreed to follow the Category 4 plan Gregory Schroeder will continue to walk and monitor his steps for weight loss and overall health benefits. We discussed the following Behavioral Modification Strategies today: increase H2O intake, keeping healthy foods in the home, increasing lean protein intake, decreasing simple carbohydrates, increasing vegetables and work on meal planning and easy cooking plans  Gregory Schroeder has agreed to follow up with our clinic in 2 weeks. He was informed of the importance of frequent follow up visits to maximize his success with intensive lifestyle modifications for his multiple health conditions. He was informed we would discuss his lab results at his next visit unless there is a critical issue that needs to be addressed sooner. Gregory Schroeder agreed to keep his next visit at the agreed upon time to discuss these results.  ALLERGIES: No Known Allergies  MEDICATIONS: Current  Outpatient Medications on File Prior to Visit  Medication Sig Dispense Refill  . levothyroxine (SYNTHROID, LEVOTHROID) 175 MCG tablet Take 175 mcg by mouth daily before breakfast.    . Magnesium 100 MG CAPS Take by mouth.     No current facility-administered medications on file prior to visit.     PAST MEDICAL HISTORY: Past Medical History:  Diagnosis Date  . Back pain   . ED (erectile dysfunction)   . Foot pain   . Gallbladder problem   . GERD (gastroesophageal  reflux disease)   . Hand cramps   . Hypothyroidism   . Joint pain   . Leg cramps   . Vitamin D deficiency     PAST SURGICAL HISTORY: Past Surgical History:  Procedure Laterality Date  . ACHILLES TENDON LENGTHENING    . CHOLECYSTECTOMY      SOCIAL HISTORY: Social History   Tobacco Use  . Smoking status: Former Smoker    Last attempt to quit: 2016    Years since quitting: 4.0  . Smokeless tobacco: Never Used  Substance Use Topics  . Alcohol use: Not on file  . Drug use: Not on file    FAMILY HISTORY: Family History  Problem Relation Age of Onset  . Cancer Mother     ROS: Review of Systems  Constitutional: Positive for malaise/fatigue.  HENT: Positive for tinnitus.   Eyes:       + Wear Glasses or Contacts  Respiratory: Positive for shortness of breath (on exertion).   Cardiovascular: Negative for orthopnea.  Gastrointestinal: Negative for nausea and vomiting.  Musculoskeletal: Positive for back pain.       + Muscle or Joint Pain  Neurological: Positive for tremors.  Endo/Heme/Allergies:       Negative for hot or cold intolerance  Psychiatric/Behavioral: Positive for depression. Negative for suicidal ideas.       + Stress    PHYSICAL EXAM: Blood pressure 129/78, pulse 74, temperature 97.9 F (36.6 C), temperature source Oral, height 6\' 2"  (1.88 m), weight 291 lb (132 kg), SpO2 99 %. Body mass index is 37.36 kg/m. Physical Exam Vitals signs reviewed.  Constitutional:      Appearance: He is well-developed.  HENT:     Head: Normocephalic and atraumatic.     Nose: Nose normal.     Mouth/Throat:     Comments: Mallampati = 1  Neck:     Musculoskeletal: Normal range of motion and neck supple.     Thyroid: No thyromegaly.  Cardiovascular:     Rate and Rhythm: Normal rate and regular rhythm.  Pulmonary:     Effort: Pulmonary effort is normal. No respiratory distress.  Abdominal:     Palpations: Abdomen is soft.     Tenderness: There is no abdominal  tenderness.  Musculoskeletal: Normal range of motion.     Comments: Range of Motion normal in all 4 extremities  Skin:    General: Skin is warm and dry.  Neurological:     Mental Status: He is alert and oriented to person, place, and time.  Psychiatric:        Behavior: Behavior normal.        Thought Content: Thought content does not include homicidal or suicidal ideation.     RECENT LABS AND TESTS: BMET    Component Value Date/Time   NA 140 01/17/2019 1012   K 4.8 01/17/2019 1012   CL 104 01/17/2019 1012   CO2 21 01/17/2019 1012   GLUCOSE 93 01/17/2019 1012  GLUCOSE 128 (H) 02/28/2011 1502   BUN 15 01/17/2019 1012   CREATININE 0.96 01/17/2019 1012   CALCIUM 9.3 01/17/2019 1012   GFRNONAA 92 01/17/2019 1012   GFRAA 107 01/17/2019 1012   Lab Results  Component Value Date   HGBA1C 5.4 01/17/2019   Lab Results  Component Value Date   INSULIN 11.2 01/17/2019   CBC    Component Value Date/Time   WBC 10.2 03/17/2011 0930   RBC 5.17 03/17/2011 0930   HGB 15.7 03/17/2011 0930   HCT 48.2 03/17/2011 0930   PLT 270 03/17/2011 0930   MCV 93.2 03/17/2011 0930   MCH 30.4 03/17/2011 0930   MCHC 32.6 03/17/2011 0930   RDW 12.9 03/17/2011 0930   LYMPHSABS 2.4 02/28/2011 1502   MONOABS 0.7 02/28/2011 1502   EOSABS 0.0 02/28/2011 1502   BASOSABS 0.0 02/28/2011 1502   Iron/TIBC/Ferritin/ %Sat No results found for: IRON, TIBC, FERRITIN, IRONPCTSAT Lipid Panel  No results found for: CHOL, TRIG, HDL, CHOLHDL, VLDL, LDLCALC, LDLDIRECT Hepatic Function Panel     Component Value Date/Time   PROT 6.6 01/17/2019 1012   ALBUMIN 4.6 01/17/2019 1012   AST 21 01/17/2019 1012   ALT 19 01/17/2019 1012   ALKPHOS 66 01/17/2019 1012   BILITOT 0.2 01/17/2019 1012   No results found for: TSH Vitamin D There are no recent lab results  ECG  shows NSR with a rate of 84 BPM INDIRECT CALORIMETER done today shows a VO2 of 387 and a REE of 2692. His calculated basal metabolic rate is  2693 thus his basal metabolic rate is worse than expected.       OBESITY BEHAVIORAL INTERVENTION VISIT  Today's visit was # 1   Starting weight: 291 lbs Starting date: 01/17/2019 Today's weight : 291 lbs Today's date: 01/17/2019 Total lbs lost to date: 0   ASK: We discussed the diagnosis of obesity with Gregory Schroeder today and Gregory Schroeder agreed to give [place] permission to discuss obesity behavioral modification therapy today.  ASSESS: Woods has the diagnosis of obesity and his BMI today is 37.35 Quashon is in the action stage of change   ADVISE: Deshun was educated on the multiple health risks of obesity as well as the benefit of weight loss to improve his health. He was advised of the need for long term treatment and the importance of lifestyle modifications to improve his current health and to decrease his risk of future health problems.  AGREE: Multiple dietary modification options and treatment options were discussed and  Jocsan agreed to follow the recommendations documented in the above note.  ARRANGE: Kaidyn was educated on the importance of frequent visits to treat obesity as outlined per CMS and USPSTF guidelines and agreed to schedule his next follow up appointment today.   Cristi Loron, am acting as Energy manager for [employer]. Manson Passey, DO  I have reviewed the above documentation for accuracy and completeness, and I agree with the above. -Corinna Capra, DO

## 2019-01-19 DIAGNOSIS — Z6837 Body mass index (BMI) 37.0-37.9, adult: Secondary | ICD-10-CM

## 2019-01-19 DIAGNOSIS — E66812 Obesity, class 2: Secondary | ICD-10-CM | POA: Insufficient documentation

## 2019-01-26 ENCOUNTER — Ambulatory Visit (INDEPENDENT_AMBULATORY_CARE_PROVIDER_SITE_OTHER): Payer: Self-pay | Admitting: Family Medicine

## 2019-01-31 ENCOUNTER — Encounter (INDEPENDENT_AMBULATORY_CARE_PROVIDER_SITE_OTHER): Payer: Self-pay | Admitting: Bariatrics

## 2019-01-31 ENCOUNTER — Ambulatory Visit (INDEPENDENT_AMBULATORY_CARE_PROVIDER_SITE_OTHER): Payer: BLUE CROSS/BLUE SHIELD | Admitting: Bariatrics

## 2019-01-31 VITALS — BP 118/75 | HR 77 | Temp 98.7°F | Ht 74.0 in | Wt 287.0 lb

## 2019-01-31 DIAGNOSIS — Z6836 Body mass index (BMI) 36.0-36.9, adult: Secondary | ICD-10-CM

## 2019-01-31 DIAGNOSIS — Z9189 Other specified personal risk factors, not elsewhere classified: Secondary | ICD-10-CM | POA: Diagnosis not present

## 2019-01-31 DIAGNOSIS — E559 Vitamin D deficiency, unspecified: Secondary | ICD-10-CM

## 2019-01-31 DIAGNOSIS — E8881 Metabolic syndrome: Secondary | ICD-10-CM

## 2019-01-31 MED ORDER — VITAMIN D (ERGOCALCIFEROL) 1.25 MG (50000 UNIT) PO CAPS: 50000.0000 [IU] | ORAL_CAPSULE | ORAL | 0 refills | Status: DC

## 2019-02-01 ENCOUNTER — Encounter (INDEPENDENT_AMBULATORY_CARE_PROVIDER_SITE_OTHER): Payer: Self-pay | Admitting: Bariatrics

## 2019-02-01 DIAGNOSIS — E8881 Metabolic syndrome: Secondary | ICD-10-CM | POA: Insufficient documentation

## 2019-02-01 NOTE — Progress Notes (Signed)
Office: [phone]  /  Fax: [phone]   HPI:   Chief Complaint: OBESITY Gregory Schroeder is here to discuss his progress with his obesity treatment plan. He is on the Category 3 plan and is following his eating plan approximately 90 % of the time. He states he is walking 60 minutes 3 times per week. Gregory Schroeder states that he is doing well and he has enjoyed the plan. Gregory Schroeder denies hunger and he has occasional cravings and stressful at times. His weight is 287 lb (130.2 kg) today and has had a weight loss of 4 pounds over a period of 2 weeks since his last visit. He has lost 4 lbs since starting treatment with Gregory Schroeder.  Vitamin D deficiency Gregory Schroeder has a diagnosis of vitamin D deficiency. His last vitamin D level was at 19.4 He is not currently taking vit D and denies nausea, vomiting or muscle weakness.  Insulin Resistance Gregory Schroeder has a diagnosis of insulin resistance based on his elevated fasting insulin level >5. Although Gregory Schroeder's blood glucose readings are still under good control, insulin resistance puts him at greater risk of metabolic syndrome and diabetes. His last A1c was at 5.4 and last insulin level was at 11.2 He is not taking medications currently and continues to work on diet and exercise to decrease risk of diabetes.  At risk for diabetes Gregory Schroeder is at higher than average risk for developing diabetes due to his obesity and insulin resistance. He currently denies polyuria or polydipsia.  ASSESSMENT AND PLAN:  Vitamin D deficiency - Plan: Vitamin D, Ergocalciferol, (DRISDOL) 1.25 MG (50000 UT) CAPS capsule  Insulin resistance  At risk for diabetes mellitus  Class 2 severe obesity with serious comorbidity and body mass index (BMI) of 36.0 to 36.9 in adult, unspecified obesity type (HCC)  PLAN:  Vitamin D Deficiency Gregory Schroeder was informed that low vitamin D levels contributes to fatigue and are associated with obesity, breast, and colon cancer. He agrees to start prescription Vit D  @50 ,000 IU every week #4 with no refills  and will follow up for routine testing of vitamin D, at least 2-3 times per year. He was informed of the risk of over-replacement of vitamin D and agrees to not increase his dose unless he discusses this with Gregory Schroeder first. Gregory Schroeder agrees to follow up as directed.  Insulin Resistance Gregory Schroeder will continue to work on weight loss, exercise, and decreasing simple carbohydrates in his diet to help decrease the risk of diabetes. We dicussed insulin resistance in detail with myself and our registered dietician and patient voiced understanding. He was informed that eating too many simple carbohydrates or too many calories at one sitting increases the likelihood of GI side effects. Gregory Schroeder agreed to follow up with Gregory Schroeder as directed to monitor his progress.  Diabetes risk counseling Gregory Schroeder was given extended (15 minutes) diabetes prevention counseling today. He is 48 y.o. male and has risk factors for diabetes including obesity and insulin resistance. We discussed intensive lifestyle modifications today with an emphasis on weight loss as well as increasing exercise and decreasing simple carbohydrates in his diet.  Obesity Gregory Schroeder is currently in the action stage of change. As such, his goal is to continue with weight loss efforts He has agreed to follow the Category 4 plan Gregory Schroeder will continue to walk 3 to 4 days per week for weight loss and overall health benefits. We discussed the following Behavioral Modification Strategies today: increase H2O intake, keeping healthy foods in the home, increasing lean protein intake, decreasing  simple carbohydrates, increasing vegetables and work on meal planning and intentional eating  Gregory Schroeder has agreed to follow up with our clinic in 2 weeks. He was informed of the importance of frequent follow up visits to maximize his success with intensive lifestyle modifications for his multiple health conditions.  ALLERGIES: No Known  Allergies  MEDICATIONS: Current Outpatient Medications on File Prior to Visit  Medication Sig Dispense Refill  . levothyroxine (SYNTHROID, LEVOTHROID) 175 MCG tablet Take 175 mcg by mouth daily before breakfast.    . Magnesium 100 MG CAPS Take by mouth.     No current facility-administered medications on file prior to visit.     PAST MEDICAL HISTORY: Past Medical History:  Diagnosis Date  . Back pain   . ED (erectile dysfunction)   . Foot pain   . Gallbladder problem   . GERD (gastroesophageal reflux disease)   . Hand cramps   . Hypothyroidism   . Joint pain   . Leg cramps   . Vitamin D deficiency     PAST SURGICAL HISTORY: Past Surgical History:  Procedure Laterality Date  . ACHILLES TENDON LENGTHENING    . CHOLECYSTECTOMY      SOCIAL HISTORY: Social History   Tobacco Use  . Smoking status: Former Smoker    Last attempt to quit: 2016    Years since quitting: 4.1  . Smokeless tobacco: Never Used  Substance Use Topics  . Alcohol use: Not on file  . Drug use: Not on file    FAMILY HISTORY: Family History  Problem Relation Age of Onset  . Cancer Mother     ROS: Review of Systems  Constitutional: Positive for weight loss.  Gastrointestinal: Negative for nausea and vomiting.  Genitourinary: Negative for frequency.  Musculoskeletal:       Negative for muscle weakness  Endo/Heme/Allergies: Negative for polydipsia.    PHYSICAL EXAM: Blood pressure 118/75, pulse 77, temperature 98.7 F (37.1 C), temperature source Oral, height 6\' 2"  (1.88 m), weight 287 lb (130.2 kg), SpO2 97 %. Body mass index is 36.85 kg/m. Physical Exam Vitals signs reviewed.  Constitutional:      Appearance: Normal appearance. He is well-developed. He is obese.  Cardiovascular:     Rate and Rhythm: Normal rate.  Pulmonary:     Effort: Pulmonary effort is normal.  Musculoskeletal: Normal range of motion.  Skin:    General: Skin is warm and dry.  Neurological:     Mental Status:  He is alert and oriented to person, place, and time.  Psychiatric:        Mood and Affect: Mood normal.        Behavior: Behavior normal.     RECENT LABS AND TESTS: BMET    Component Value Date/Time   NA 140 01/17/2019 1012   K 4.8 01/17/2019 1012   CL 104 01/17/2019 1012   CO2 21 01/17/2019 1012   GLUCOSE 93 01/17/2019 1012   GLUCOSE 128 (H) 02/28/2011 1502   BUN 15 01/17/2019 1012   CREATININE 0.96 01/17/2019 1012   CALCIUM 9.3 01/17/2019 1012   GFRNONAA 92 01/17/2019 1012   GFRAA 107 01/17/2019 1012   Lab Results  Component Value Date   HGBA1C 5.4 01/17/2019   Lab Results  Component Value Date   INSULIN 11.2 01/17/2019   CBC    Component Value Date/Time   WBC 10.2 03/17/2011 0930   RBC 5.17 03/17/2011 0930   HGB 15.7 03/17/2011 0930   HCT 48.2 03/17/2011 0930  PLT 270 03/17/2011 0930   MCV 93.2 03/17/2011 0930   MCH 30.4 03/17/2011 0930   MCHC 32.6 03/17/2011 0930   RDW 12.9 03/17/2011 0930   LYMPHSABS 2.4 02/28/2011 1502   MONOABS 0.7 02/28/2011 1502   EOSABS 0.0 02/28/2011 1502   BASOSABS 0.0 02/28/2011 1502   Iron/TIBC/Ferritin/ %Sat No results found for: IRON, TIBC, FERRITIN, IRONPCTSAT Lipid Panel  No results found for: CHOL, TRIG, HDL, CHOLHDL, VLDL, LDLCALC, LDLDIRECT Hepatic Function Panel     Component Value Date/Time   PROT 6.6 01/17/2019 1012   ALBUMIN 4.6 01/17/2019 1012   AST 21 01/17/2019 1012   ALT 19 01/17/2019 1012   ALKPHOS 66 01/17/2019 1012   BILITOT 0.2 01/17/2019 1012      Component Value Date/Time   TSH 6.35 (A) 01/05/2019     Ref. Range 01/17/2019 10:12  Vitamin D, 25-Hydroxy Latest Ref Range: 30.0 - 100.0 ng/mL 19.4 (L)     OBESITY BEHAVIORAL INTERVENTION VISIT  Today's visit was # 2   Starting weight: 291 lbs Starting date: 01/17/2019 Today's weight : 287 lbs Today's date: 01/31/2019 Total lbs lost to date: 4   ASK: We discussed the diagnosis of obesity with Gregory Schroeder today and Gregory Schroeder agreed to  give [place] permission to discuss obesity behavioral modification therapy today.  ASSESS: Gregory Schroeder has the diagnosis of obesity and his BMI today is 36.83 Gregory Schroeder is in the action stage of change   ADVISE: Gregory Schroeder was educated on the multiple health risks of obesity as well as the benefit of weight loss to improve his health. He was advised of the need for long term treatment and the importance of lifestyle modifications to improve his current health and to decrease his risk of future health problems.  AGREE: Multiple dietary modification options and treatment options were discussed and  Gregory Schroeder agreed to follow the recommendations documented in the above note.  ARRANGE: Lajarvis was educated on the importance of frequent visits to treat obesity as outlined per CMS and USPSTF guidelines and agreed to schedule his next follow up appointment today.  Gregory Schroeder, am acting as Energy manager for Gregory Schroeder. Gregory Passey, DO  I have reviewed the above documentation for accuracy and completeness, and I agree with the above. -Corinna Capra, DO

## 2019-02-02 ENCOUNTER — Ambulatory Visit (INDEPENDENT_AMBULATORY_CARE_PROVIDER_SITE_OTHER): Payer: BLUE CROSS/BLUE SHIELD | Admitting: Psychology

## 2019-02-02 DIAGNOSIS — F3289 Other specified depressive episodes: Secondary | ICD-10-CM

## 2019-02-02 NOTE — Progress Notes (Signed)
Office: [phone]  /  Fax: [phone]    Date: February 02, 2019  Time Seen: 3:02pm Duration: 47 minutes Provider: Lawerance CruelGaytri Makenzi Bannister, PsyD Type of Session: Intake for Individual Therapy  Type of Contact: Face-to-face  Informed Consent:The provider's role was explained to Jonn ShinglesMichael D Plantz. The provider reviewed and discussed issues of confidentiality, privacy, and limits therein. In addition to verbal informed consent, written informed consent for psychological services was obtained from Casimiro NeedleMichael prior to the initial intake interview. Written consent included information concerning the practice, financial arrangements, and confidentiality and patients' rights. Since the clinic is not a 24/7 crisis center, mental health emergency resources were shared in the form of a handout, and the provider explained MyChart, e-mail, voicemail, and/or other messaging systems should be utilized only for non-emergency reasons. Casimiro NeedleMichael verbally acknowledged understanding of the aforementioned, and agreed to use mental health emergency resources discussed if needed. Moreover, Casimiro NeedleMichael agreed information may be shared with other [facility] providers as needed for coordination of care, and written consent was obtained.   Chief Complaint: Casimiro NeedleMichael was referred by Dr. Corinna CapraAngel Brown due to depression with emotional eating behaviors. Per the note for the visit with Dr. Corinna CapraAngel Brown on January 17, 2019, "Casimiro NeedleMichael is eating at night. He struggles with emotional eating and using food for comfort to the extent that it is negatively impacting his health. He often snacks when he is not hungry. Casimiro NeedleMichael sometimes feels he is out of control and then feels guilty that he made poor food choices. He has been working on behavior modification techniques to help reduce his emotional eating and has been somewhat successful. He shows no sign of suicidal or homicidal ideations."  During today's appointment, Casimiro NeedleMichael  explained that approximately 2 years ago he lost 130 pounds by being mindful about what he was eating and working out; however, since then he continues to gain weight. He also disclosed engaging in emotional eating with the last time being last week when he learned his "estranged" wife has a malignant tumor as she has an "aggressive strain" of cancer. This resulted in consuming a quart of ice cream. He explained he tends to crave ice cream as well as other sweet and salty foods. Regarding the currently prescribed structured meal plan, he reported is going "good." Nevertheless he disclosed that twice a week he is unable to eat until 1 PM due to work.  Casimiro NeedleMichael was asked to complete a questionnaire assessing various behaviors related to emotional eating. Casimiro NeedleMichael endorsed the following: overeat when you are celebrating, experience food cravings on a regular basis, eat certain foods when you are anxious, stressed, depressed, or your feelings are hurt, use food to help you cope with emotional situations, find food is comforting to you, overeat when you are worried about something, overeat frequently when you are bored or lonely, overeat when you are alone, but eat much less when you are with other people and eat as a reward.  HPI: Per the note for the initial visit with Dr. Corinna CapraAngel Brown on January 17, 2019, Casimiro NeedleMichael shared he craves salty carbohydrates and chocolate. During the initial appointment with Dr. Corinna CapraAngel Brown, Casimiro NeedleMichael reported experiencing the following: snacking frequently in the evenings, frequently drinking liquids with calories, frequently making poor food choices, frequently eating larger portions than normal , binge eating behaviors, struggling with emotional eating and skipping meals frequently. During today's appointment, Casimiro NeedleMichael reported the onset of emotional eating was during childhood. He denied a history of purging and engagement in  other compensatory strategies and has never been diagnosed with an  eating disorder.  Mental Status Examination: Casimiro NeedleMichael arrived on time for the appointment. He presented as appropriately dressed and groomed. Casimiro NeedleMichael appeared his stated age and demonstrated adequate orientation to time, place, person, and purpose of the appointment. He also demonstrated appropriate eye contact. No psychomotor abnormalities or behavioral peculiarities noted. His mood was euthymic with congruent affect. His thought processes were logical, linear, and goal-directed. No hallucinations, delusions, bizarre thinking or behavior reported or observed. Judgment, insight, and impulse control appeared to be grossly intact. There was no evidence of paraphasias (i.e., errors in speech, gross mispronunciations, and word substitutions), repetition deficits, or disturbances in volume or prosody (i.e., rhythm and intonation). There was no evidence of attention or memory impairments. Casimiro NeedleMichael denied current suicidal and homicidal ideation, plan, and intent.   Family & Psychosocial History: Casimiro NeedleMichael reported he has been separated from his wife for approximately 2 years and has been in relationship for approximately 1 year. He indicated he has 2 sons, ages 4016 and 2720. He noted he is often alone at home as his children reside with their mother. Currently, Casimiro NeedleMichael indicated he is employed as a used Radiation protection practitionercar manager and his highest level of education obtained is a Heritage managerbachelor of science degree. He indicated his social support system consists of his parents and girlfriend. Regarding religion and spirituality, Casimiro NeedleMichael indicated, "I do not believe in religion but I am spiritual."  Medical History:  Past Medical History:  Diagnosis Date  . Back pain   . ED (erectile dysfunction)   . Foot pain   . Gallbladder problem   . GERD (gastroesophageal reflux disease)   . Hand cramps   . Hypothyroidism   . Joint pain   . Leg cramps   . Vitamin D deficiency    Past Surgical History:  Procedure Laterality Date  . ACHILLES  TENDON LENGTHENING    . CHOLECYSTECTOMY     Current Outpatient Medications on File Prior to Visit  Medication Sig Dispense Refill  . levothyroxine (SYNTHROID, LEVOTHROID) 175 MCG tablet Take 175 mcg by mouth daily before breakfast.    . Magnesium 100 MG CAPS Take by mouth.    . Vitamin D, Ergocalciferol, (DRISDOL) 1.25 MG (50000 UT) CAPS capsule Take 1 capsule (50,000 Units total) by mouth every 7 (seven) days. 4 capsule 0   No current facility-administered medications on file prior to visit.   When asked about head injury injuries, Casimiro NeedleMichael indicated he may have suffered "some concussions in high school playing football." He denied a history of losing consciousness and denied receiving medical attention.  Mental Health History: Approximately 6 years ago, Casimiro NeedleMichael reported attending 1 session of couples therapy due to his wife's infidelity. Two years ago, they attended an additional 4 sessions of couples therapy for the same reason. Casimiro NeedleMichael denied ever meeting with a psychiatrist and has never been prescribed psychotropic medications. He also denied ever being hospitalized for any psychiatric concerns. Casimiro NeedleMichael further denied a family history of mental health related concerns. Casimiro NeedleMichael denied a trauma history, including psychological, physical  and sexual abuse, as well as neglect.   Casimiro NeedleMichael reported experiencing depressed mood secondary to learning about his wife's diagnosis. He also endorsed experiencing fatigue, decreased self-esteem due to body image concerns, social withdrawal due to the separation, irritability, and worry thoughts regarding finances and wife's health. Casimiro NeedleMichael further shared he "internalizes anger." More specifically, he explained, "I keep it to myself and try to think it through." Furthermore, Casimiro NeedleMichael  reported smoking marijuana approximately 3-4 times a week. He explained that 1/4 ounce of marijuana lasts him approximately 4 months. Eshan further added, "I do not like to lose  control. Just to relax." He indicated he smokes while at home and never leaves his home under the influence. He also denied a history of consequences secondary to his marijuana use. Regarding alcohol use, Jadiah indicated he may have a "beer every now and then." He reiterated he does not like to lose control.  Alegandro denied experiencing the following: hopelessness, sleep difficulties, appetite issues, attention and concentration issues, memory concerns, obsessions and compulsions, hallucinations and delusions, mania and angry outbursts. He also denied history of and current suicidal ideation, plan, and intent; history of and current homicidal ideation, plan, and intent; and history of and current engagement in self-harm.  The following strengths were reported by Casimiro Needle: self-sustaining, responsible, family-oriented and determined. The following strengths were observed by this provider: ability to express thoughts and feelings during the therapeutic session, ability to establish and benefit from a therapeutic relationship, ability to learn and practice coping skills, willingness to work toward established goal(s) with the clinic and ability to engage in reciprocal conversation.  Legal History:  Josecruz denied a history of legal involvement.   Structured Assessment Results: The Patient Health Questionnaire-9 (PHQ-9) is a self-report measure that assesses symptoms and severity of depression over the course of the last two weeks. [place] obtained a score of 2 suggesting minimal depression. Cobey finds the endorsed symptoms to be somewhat difficult. Depression screen [place] 2/9 02/02/2019  Decreased Interest 0  Down, Depressed, Hopeless 1  PHQ - 2 Score 1  Altered sleeping 0  Tired, decreased energy 1  Change in appetite 0  Feeling bad or failure about yourself  0  Trouble concentrating 0  Moving slowly or fidgety/restless 0  Suicidal thoughts 0  PHQ-9 Score 2  Difficult doing work/chores -   The  Generalized Anxiety Disorder-7 (GAD-7) is a brief self-report measure that assesses symptoms of anxiety over the course of the last two weeks. [place] obtained a score of 7 suggesting mild anxiety. GAD 7 : Generalized Anxiety Score 02/02/2019  Nervous, Anxious, on Edge 1  Control/stop worrying 1  Worry too much - different things 1  Trouble relaxing 1  Restless 1  Easily annoyed or irritable 1  Afraid - awful might happen 1  Total GAD 7 Score 7  Anxiety Difficulty Somewhat difficult   Interventions: A chart review was conducted prior to the clinical intake interview. The PHQ-9 and GAD-7 were administered and a clinical intake interview was completed. In addition, Malahki was asked to complete a Mood and Food questionnaire to assess various behaviors related to emotional eating. Throughout session, empathic reflections and validation was provided. Continuing treatment with this provider was discussed and a treatment goal was established. Psychoeducation regarding emotional versus physical hunger was provided. Darcy was given a handout to utilize between now and the next appointment to increase awareness of hunger patterns and subsequent eating.  Provisional DSM-5 Diagnosis: 311 (F32.8) Other Specified Depressive Disorder, Emotional Eating Behaviors  Plan: Kosuke appears able and willing to participate as evidenced by collaboration on a treatment goal, engagement in reciprocal conversation, and asking questions as needed for clarification. The next appointment will be scheduled in two weeks. The following treatment goal was established: decrease emotional eating. For the aforementioned goal, [place] can benefit from biweekly individual therapy sessions that are brief in duration for approximately four to six sessions. The treatment  modality will be individual therapeutic services, including an eclectic therapeutic approach utilizing techniques from Cognitive Behavioral Therapy, Patient Centered  Therapy, Dialectical Behavior Therapy, Acceptance and Commitment Therapy, Interpersonal Therapy, and Cognitive Restructuring. Therapeutic approach will include various interventions as appropriate, such as validation, support, mindfulness, thought defusion, reframing, psychoeducation, values assessment, and role playing. This provider will regularly review the treatment plan and medical chart to keep informed of status changes. Cordarro expressed understanding and agreement with the initial treatment plan of care.

## 2019-02-12 ENCOUNTER — Encounter (INDEPENDENT_AMBULATORY_CARE_PROVIDER_SITE_OTHER): Payer: Self-pay | Admitting: Bariatrics

## 2019-02-14 ENCOUNTER — Encounter (INDEPENDENT_AMBULATORY_CARE_PROVIDER_SITE_OTHER): Payer: Self-pay | Admitting: Physician Assistant

## 2019-02-14 ENCOUNTER — Ambulatory Visit (INDEPENDENT_AMBULATORY_CARE_PROVIDER_SITE_OTHER): Payer: BLUE CROSS/BLUE SHIELD | Admitting: Physician Assistant

## 2019-02-14 VITALS — BP 110/74 | HR 89 | Temp 98.4°F | Ht 74.0 in | Wt 289.0 lb

## 2019-02-14 DIAGNOSIS — E8881 Metabolic syndrome: Secondary | ICD-10-CM

## 2019-02-14 DIAGNOSIS — E559 Vitamin D deficiency, unspecified: Secondary | ICD-10-CM

## 2019-02-14 DIAGNOSIS — Z9189 Other specified personal risk factors, not elsewhere classified: Secondary | ICD-10-CM

## 2019-02-14 DIAGNOSIS — Z6837 Body mass index (BMI) 37.0-37.9, adult: Secondary | ICD-10-CM | POA: Diagnosis not present

## 2019-02-14 MED ORDER — VITAMIN D (ERGOCALCIFEROL) 1.25 MG (50000 UNIT) PO CAPS: 50000.0000 [IU] | ORAL_CAPSULE | ORAL | 0 refills | Status: DC

## 2019-02-14 NOTE — Progress Notes (Signed)
Office: [phone]  /  Fax: [phone]   HPI:   Chief Complaint: OBESITY Casimiro NeedleMichael is here to discuss his progress with his obesity treatment plan. He is on the Category 4 plan and is following his eating plan approximately 90% of the time. He states he is walking 60 minutes 3 times per week. Casimiro NeedleMichael reports that he got off track and did some emotional eating recently due to some family stressors. He is ready to get back on track. His weight is 289 lb (131.1 kg) today and has had a weight gain of 2 lbs since his last visit. He has lost 2 lbs since starting treatment with us.  Vitamin D deficiency Casimiro NeedleMichael has a diagnosis of Vitamin D deficiency. He is currently taking prescription Vit D and denies nausea, vomiting or muscle weakness.  At risk for osteopenia and osteoporosis Casimiro NeedleMichael is at higher risk of osteopenia and osteoporosis due to Vitamin D deficiency.   Insulin Resistance Casimiro NeedleMichael has a diagnosis of insulin resistance based on his elevated fasting insulin level >5. Although Topher's blood glucose readings are still under good control, insulin resistance puts him at greater risk of metabolic syndrome and diabetes. He is not taking metformin currently and continues to work on diet and exercise to decrease risk of diabetes. Casimiro NeedleMichael is on no medications and denies polyphagia.  ASSESSMENT AND PLAN:  Vitamin D deficiency - Plan: Vitamin D, Ergocalciferol, (DRISDOL) 1.25 MG (50000 UT) CAPS capsule  Insulin resistance  At risk for osteoporosis  Class 2 severe obesity with serious comorbidity and body mass index (BMI) of 37.0 to 37.9 in adult, unspecified obesity type (HCC)  PLAN:  Vitamin D Deficiency Casimiro NeedleMichael was informed that low Vitamin D levels contributes to fatigue and are associated with obesity, breast, and colon cancer. He agrees to continue to take prescription Vit D @ 50,000 IU every week #4 with no refills and will follow-up for routine testing of Vitamin D, at least  2-3 times per year. He was informed of the risk of over-replacement of Vitamin D and agrees to not increase her dose unless she discusses this with us first. Casimiro NeedleMichael agrees to follow-up with our clinic in 2 weeks.  At risk for osteopenia and osteoporosis Casimiro NeedleMichael was given extended  (15 minutes) osteoporosis prevention counseling today. Casimiro NeedleMichael is at risk for osteopenia and osteoporsis due to his Vitamin D deficiency. He was encouraged to take his Vitamin D and follow his higher calcium diet and increase strengthening exercise to help strengthen his bones and decrease his risk of osteopenia and osteoporosis.  Insulin Resistance Casimiro NeedleMichael will continue to work on weight loss, exercise, and decreasing simple carbohydrates in his diet to help decrease the risk of diabetes. We dicussed metformin including benefits and risks. He was informed that eating too many simple carbohydrates or too many calories at one sitting increases the likelihood of GI side effects. Casimiro NeedleMichael is not on metformin for now and prescription was not written today. Casimiro NeedleMichael agreed to follow-up with us as directed to monitor his progress.  Obesity Casimiro NeedleMichael is currently in the action stage of change. As such, his goal is to continue with weight loss efforts. He has agreed to follow the Category 4 plan and journaling 550-700 calories + 45 grams of protein at supper. Casimiro NeedleMichael has been instructed to work up to a goal of 150 minutes of combined cardio and strengthening exercise per week for weight loss and overall health benefits. We discussed the following Behavioral Modification Strategies today: no skipping meals  and work on meal planning and easy cooking plans.  Gordie has agreed to follow-up with our clinic in 2 weeks. He was informed of the importance of frequent follow up visits to maximize his success with intensive lifestyle modifications for his multiple health conditions.  ALLERGIES: No Known Allergies  MEDICATIONS: Current  Outpatient Medications on File Prior to Visit  Medication Sig Dispense Refill  . levothyroxine (SYNTHROID, LEVOTHROID) 175 MCG tablet Take 175 mcg by mouth daily before breakfast.    . Magnesium 100 MG CAPS Take by mouth.    . Vitamin D, Ergocalciferol, (DRISDOL) 1.25 MG (50000 UT) CAPS capsule Take 1 capsule (50,000 Units total) by mouth every 7 (seven) days. 4 capsule 0   No current facility-administered medications on file prior to visit.     PAST MEDICAL HISTORY: Past Medical History:  Diagnosis Date  . Back pain   . ED (erectile dysfunction)   . Foot pain   . Gallbladder problem   . GERD (gastroesophageal reflux disease)   . Hand cramps   . Hypothyroidism   . Joint pain   . Leg cramps   . Vitamin D deficiency     PAST SURGICAL HISTORY: Past Surgical History:  Procedure Laterality Date  . ACHILLES TENDON LENGTHENING    . CHOLECYSTECTOMY      SOCIAL HISTORY: Social History   Tobacco Use  . Smoking status: Former Smoker    Last attempt to quit: 2016    Years since quitting: 4.1  . Smokeless tobacco: Never Used  Substance Use Topics  . Alcohol use: Not on file  . Drug use: Not on file    FAMILY HISTORY: Family History  Problem Relation Age of Onset  . Cancer Mother    ROS: Review of Systems  Constitutional: Negative for weight loss.  Gastrointestinal: Negative for nausea and vomiting.  Musculoskeletal:       Negative for muscle weakness.  Endo/Heme/Allergies:       Negative for polyphagia. Negative for hypoglycemia.   PHYSICAL EXAM: Blood pressure 110/74, pulse 89, temperature 98.4 F (36.9 C), temperature source Oral, height 6\' 2"  (1.88 m), weight 289 lb (131.1 kg), SpO2 98 %. Body mass index is 37.11 kg/m. Physical Exam Vitals signs reviewed.  Constitutional:      Appearance: Normal appearance. He is obese.  Cardiovascular:     Rate and Rhythm: Normal rate.     Pulses: Normal pulses.  Pulmonary:     Effort: Pulmonary effort is normal.      Breath sounds: Normal breath sounds.  Musculoskeletal: Normal range of motion.  Skin:    General: Skin is warm and dry.  Neurological:     Mental Status: He is alert and oriented to person, place, and time.  Psychiatric:        Behavior: Behavior normal.   RECENT LABS AND TESTS: BMET    Component Value Date/Time   NA 140 01/17/2019 1012   K 4.8 01/17/2019 1012   CL 104 01/17/2019 1012   CO2 21 01/17/2019 1012   GLUCOSE 93 01/17/2019 1012   GLUCOSE 128 (H) 02/28/2011 1502   BUN 15 01/17/2019 1012   CREATININE 0.96 01/17/2019 1012   CALCIUM 9.3 01/17/2019 1012   GFRNONAA 92 01/17/2019 1012   GFRAA 107 01/17/2019 1012   Lab Results  Component Value Date   HGBA1C 5.4 01/17/2019   Lab Results  Component Value Date   INSULIN 11.2 01/17/2019   CBC    Component Value Date/Time  WBC 10.2 03/17/2011 0930   RBC 5.17 03/17/2011 0930   HGB 15.7 03/17/2011 0930   HCT 48.2 03/17/2011 0930   PLT 270 03/17/2011 0930   MCV 93.2 03/17/2011 0930   MCH 30.4 03/17/2011 0930   MCHC 32.6 03/17/2011 0930   RDW 12.9 03/17/2011 0930   LYMPHSABS 2.4 02/28/2011 1502   MONOABS 0.7 02/28/2011 1502   EOSABS 0.0 02/28/2011 1502   BASOSABS 0.0 02/28/2011 1502   Iron/TIBC/Ferritin/ %Sat No results found for: IRON, TIBC, FERRITIN, IRONPCTSAT Lipid Panel  No results found for: CHOL, TRIG, HDL, CHOLHDL, VLDL, LDLCALC, LDLDIRECT Hepatic Function Panel     Component Value Date/Time   PROT 6.6 01/17/2019 1012   ALBUMIN 4.6 01/17/2019 1012   AST 21 01/17/2019 1012   ALT 19 01/17/2019 1012   ALKPHOS 66 01/17/2019 1012   BILITOT 0.2 01/17/2019 1012      Component Value Date/Time   TSH 6.35 (A) 01/05/2019    Ref. Range 01/17/2019 10:12  Vitamin D, 25-Hydroxy Latest Ref Range: 30.0 - 100.0 ng/mL 19.4 (L)    OBESITY BEHAVIORAL INTERVENTION VISIT  Today's visit was #4  Starting weight: 291 lbs Starting date: 01/17/2019 Today's weight: 289 lbs  Today's date: 02/14/2019 Total lbs lost to  date: 2    02/14/2019  Height 6\' 2"  (1.88 m)  Weight 289 lb (131.1 kg)  BMI (Calculated) 37.09  BLOOD PRESSURE - SYSTOLIC 110  BLOOD PRESSURE - DIASTOLIC 74   Body Fat % 33 %  Total Body Water (lbs) 135.2 lbs   ASK: We discussed the diagnosis of obesity with Jonn Shingles today and Casimiro Needle agreed to give [place] permission to discuss obesity behavioral modification therapy today.  ASSESS: Krista has the diagnosis of obesity and his BMI today is 37.09. Ryerson is in the action stage of change.   ADVISE: Yonatan was educated on the multiple health risks of obesity as well as the benefit of weight loss to improve his health. He was advised of the need for long term treatment and the importance of lifestyle modifications to improve his current health and to decrease his risk of future health problems.  AGREE: Multiple dietary modification options and treatment options were discussed and  Travonte agreed to follow the recommendations documented in the above note.  ARRANGE: Dikembe was educated on the importance of frequent visits to treat obesity as outlined per CMS and USPSTF guidelines and agreed to schedule his next follow up appointment today.  Fernanda Drum, am acting as transcriptionist for Alois Cliche, PA-C I, Alois Cliche, PA-C have reviewed above note and agree with its content

## 2019-02-16 ENCOUNTER — Ambulatory Visit (INDEPENDENT_AMBULATORY_CARE_PROVIDER_SITE_OTHER): Payer: Self-pay | Admitting: Psychology

## 2019-03-02 ENCOUNTER — Encounter (INDEPENDENT_AMBULATORY_CARE_PROVIDER_SITE_OTHER): Payer: Self-pay | Admitting: Physician Assistant

## 2019-03-02 ENCOUNTER — Other Ambulatory Visit: Payer: Self-pay

## 2019-03-02 ENCOUNTER — Ambulatory Visit (INDEPENDENT_AMBULATORY_CARE_PROVIDER_SITE_OTHER): Payer: BLUE CROSS/BLUE SHIELD | Admitting: Physician Assistant

## 2019-03-02 VITALS — BP 118/79 | HR 90 | Temp 98.3°F | Ht 74.0 in | Wt 288.0 lb

## 2019-03-02 DIAGNOSIS — E038 Other specified hypothyroidism: Secondary | ICD-10-CM

## 2019-03-02 DIAGNOSIS — Z9189 Other specified personal risk factors, not elsewhere classified: Secondary | ICD-10-CM

## 2019-03-02 DIAGNOSIS — E559 Vitamin D deficiency, unspecified: Secondary | ICD-10-CM

## 2019-03-02 DIAGNOSIS — E66812 Obesity, class 2: Secondary | ICD-10-CM

## 2019-03-02 DIAGNOSIS — Z6837 Body mass index (BMI) 37.0-37.9, adult: Secondary | ICD-10-CM

## 2019-03-02 MED ORDER — VITAMIN D (ERGOCALCIFEROL) 1.25 MG (50000 UNIT) PO CAPS: 50000.0000 [IU] | ORAL_CAPSULE | ORAL | 0 refills | Status: AC

## 2019-03-02 NOTE — Progress Notes (Signed)
Office: [phone]  /  Fax: [phone]   HPI:   Chief Complaint: OBESITY Gregory Schroeder is here to discuss his progress with his obesity treatment plan. He is on the Category 4 plan and journaling 550-700 calories + 45 grams of protein for supper. He is following his eating plan approximately 80% of the time. He states he is walking 60 minutes 4 times per week. [name] did well with weight loss. He reports that he has been stressed with life events recently but he continues to try to make good choices. His weight is 288 lb (130.6 kg) today and has had a weight loss of 1 pound over a period of 2 weeks since his last visit. He has lost 3 lbs since starting treatment with [place].  Vitamin D deficiency Gregory Schroeder has a diagnosis of Vitamin D deficiency. He is currently taking prescription Vit D and denies nausea, vomiting or muscle weakness.  At risk for osteopenia and osteoporosis Gregory Schroeder is at higher risk of osteopenia and osteoporosis due to Vitamin D deficiency.   Hypothyroidism Gregory Schroeder has a diagnosis of hypothyroidism. He is on levothyroxine. He denies hot or cold intolerance.  ASSESSMENT AND PLAN:  Vitamin D deficiency - Plan: Vitamin D, Ergocalciferol, (DRISDOL) 1.25 MG (50000 UT) CAPS capsule  Other specified hypothyroidism  At risk for osteoporosis  Class 2 severe obesity with serious comorbidity and body mass index (BMI) of 37.0 to 37.9 in adult, unspecified obesity type (HCC)  PLAN:  Vitamin D Deficiency Gregory Schroeder was informed that low Vitamin D levels contributes to fatigue and are associated with obesity, breast, and colon cancer. He agrees to continue to take prescription Vit D @ 50,000 IU every week #4 with 0 refills and will follow-up for routine testing of Vitamin D, at least 2-3 times per year. He was informed of the risk of over-replacement of Vitamin D and agrees to not increase his dose unless he discusses this with [place] first. Gregory Schroeder agrees to follow-up with our clinic in 2  weeks.  At risk for osteopenia and osteoporosis Gregory Schroeder was given extended  (15 minutes) osteoporosis prevention counseling today. [name] is at risk for osteopenia and osteoporsis due to his Vitamin D deficiency. He was encouraged to take his Vitamin D and follow his higher calcium diet and increase strengthening exercise to help strengthen his bones and decrease his risk of osteopenia and osteoporosis.  Hypothyroidism [name] was informed of the importance of good thyroid control to help with weight loss efforts. He was also informed that supertheraputic thyroid levels are dangerous and will not improve weight loss results. [name] will continue on his levothyroxine and will follow-up with our clinic in 2 weeks.  Obesity Gregory Schroeder is currently in the action stage of change. As such, his goal is to continue with weight loss efforts. He has agreed to follow the Category 4 plan and journal 550-700 calories + 45 grams of protein at supper. Gregory Schroeder has been instructed to work up to a goal of 150 minutes of combined cardio and strengthening exercise per week for weight loss and overall health benefits. We discussed the following Behavioral Modification Strategies today: work on meal planning and easy cooking plans and keeping healthy foods in the home.  Gregory Schroeder has agreed to follow-up with our clinic in 2 weeks. He was informed of the importance of frequent follow up visits to maximize his success with intensive lifestyle modifications for his multiple health conditions.  ALLERGIES: No Known Allergies  MEDICATIONS: Current Outpatient Medications on File Prior to  Visit  Medication Sig Dispense Refill  . levothyroxine (SYNTHROID, LEVOTHROID) 175 MCG tablet Take 175 mcg by mouth daily before breakfast.    . Magnesium 100 MG CAPS Take by mouth.     No current facility-administered medications on file prior to visit.     PAST MEDICAL HISTORY: Past Medical History:  Diagnosis Date  . Back pain    . ED (erectile dysfunction)   . Foot pain   . Gallbladder problem   . GERD (gastroesophageal reflux disease)   . Hand cramps   . Hypothyroidism   . Joint pain   . Leg cramps   . Vitamin D deficiency     PAST SURGICAL HISTORY: Past Surgical History:  Procedure Laterality Date  . ACHILLES TENDON LENGTHENING    . CHOLECYSTECTOMY      SOCIAL HISTORY: Social History   Tobacco Use  . Smoking status: Former Smoker    Last attempt to quit: 2016    Years since quitting: 4.1  . Smokeless tobacco: Never Used  Substance Use Topics  . Alcohol use: Not on file  . Drug use: Not on file    FAMILY HISTORY: Family History  Problem Relation Age of Onset  . Cancer Mother    ROS: Review of Systems  Constitutional: Positive for weight loss.  Gastrointestinal: Negative for nausea and vomiting.  Musculoskeletal:       Negative for muscle weakness.  Endo/Heme/Allergies:       Negative for heat/cold intolerance. Negative for hypoglycemia.   PHYSICAL EXAM: Blood pressure 118/79, pulse 90, temperature 98.3 F (36.8 C), height 6\' 2"  (1.88 m), weight 288 lb (130.6 kg), SpO2 96 %. Body mass index is 36.98 kg/m. Physical Exam Vitals signs reviewed.  Constitutional:      Appearance: Normal appearance. He is obese.  Cardiovascular:     Rate and Rhythm: Normal rate.     Pulses: Normal pulses.  Pulmonary:     Effort: Pulmonary effort is normal.     Breath sounds: Normal breath sounds.  Musculoskeletal: Normal range of motion.  Skin:    General: Skin is warm and dry.  Neurological:     Mental Status: He is alert and oriented to person, place, and time.  Psychiatric:        Behavior: Behavior normal.   RECENT LABS AND TESTS: BMET    Component Value Date/Time   NA 140 01/17/2019 1012   K 4.8 01/17/2019 1012   CL 104 01/17/2019 1012   CO2 21 01/17/2019 1012   GLUCOSE 93 01/17/2019 1012   GLUCOSE 128 (H) 02/28/2011 1502   BUN 15 01/17/2019 1012   CREATININE 0.96 01/17/2019  1012   CALCIUM 9.3 01/17/2019 1012   GFRNONAA 92 01/17/2019 1012   GFRAA 107 01/17/2019 1012   Lab Results  Component Value Date   HGBA1C 5.4 01/17/2019   Lab Results  Component Value Date   INSULIN 11.2 01/17/2019   CBC    Component Value Date/Time   WBC 10.2 03/17/2011 0930   RBC 5.17 03/17/2011 0930   HGB 15.7 03/17/2011 0930   HCT 48.2 03/17/2011 0930   PLT 270 03/17/2011 0930   MCV 93.2 03/17/2011 0930   MCH 30.4 03/17/2011 0930   MCHC 32.6 03/17/2011 0930   RDW 12.9 03/17/2011 0930   LYMPHSABS 2.4 02/28/2011 1502   MONOABS 0.7 02/28/2011 1502   EOSABS 0.0 02/28/2011 1502   BASOSABS 0.0 02/28/2011 1502   Iron/TIBC/Ferritin/ %Sat No results found for: IRON, TIBC, FERRITIN, IRONPCTSAT  Lipid Panel  No results found for: CHOL, TRIG, HDL, CHOLHDL, VLDL, LDLCALC, LDLDIRECT Hepatic Function Panel     Component Value Date/Time   PROT 6.6 01/17/2019 1012   ALBUMIN 4.6 01/17/2019 1012   AST 21 01/17/2019 1012   ALT 19 01/17/2019 1012   ALKPHOS 66 01/17/2019 1012   BILITOT 0.2 01/17/2019 1012      Component Value Date/Time   TSH 6.35 (A) 01/05/2019   Results for LAIF, KOHLHOFF (MRN [id number]) as of 03/02/2019 09:31  Ref. Range 01/17/2019 10:12  Vitamin D, 25-Hydroxy Latest Ref Range: 30.0 - 100.0 ng/mL 19.4 (L)   OBESITY BEHAVIORAL INTERVENTION VISIT  Today's visit was #4  Starting weight: 291 lbs Starting date: 01/17/2019 Today's weight: 288 lbs Today's date: 03/02/2019 Total lbs lost to date: 3    03/02/2019  Height 6\' 2"  (1.88 m)  Weight 288 lb (130.6 kg)  BMI (Calculated) 36.96  BLOOD PRESSURE - SYSTOLIC 118  BLOOD PRESSURE - DIASTOLIC 79   Body Fat % 33.6 %  Total Body Water (lbs) 134.4 lbs   ASK: We discussed the diagnosis of obesity with Jonn Shingles today and Casimiro Needle agreed to give [place] permission to discuss obesity behavioral modification therapy today.  ASSESS: Tabias has the diagnosis of obesity and his BMI today is 36.96. Zandon  is in the action stage of change.   ADVISE: Aaidan was educated on the multiple health risks of obesity as well as the benefit of weight loss to improve his health. He was advised of the need for long term treatment and the importance of lifestyle modifications to improve his current health and to decrease his risk of future health problems.  AGREE: Multiple dietary modification options and treatment options were discussed and  Ogle agreed to follow the recommendations documented in the above note.  ARRANGE: Life was educated on the importance of frequent visits to treat obesity as outlined per CMS and USPSTF guidelines and agreed to schedule his next follow up appointment today.  Fernanda Drum, am acting as transcriptionist for Alois Cliche, PA-C I, Alois Cliche, PA-C have reviewed above note and agree with its content

## 2019-03-16 ENCOUNTER — Encounter (INDEPENDENT_AMBULATORY_CARE_PROVIDER_SITE_OTHER): Payer: Self-pay

## 2019-03-21 ENCOUNTER — Ambulatory Visit (INDEPENDENT_AMBULATORY_CARE_PROVIDER_SITE_OTHER): Payer: BLUE CROSS/BLUE SHIELD | Admitting: Physician Assistant

## 2019-03-31 ENCOUNTER — Telehealth (INDEPENDENT_AMBULATORY_CARE_PROVIDER_SITE_OTHER): Payer: Self-pay | Admitting: Psychology

## 2019-03-31 NOTE — Telephone Encounter (Signed)
  Office: [phone]  /  Fax: [phone]  Date of Call: March 31, 2019 Time of Call: 9:48am Duration of Call: 1 minute Provider: Lawerance Cruel, PsyD  CONTENT: This provider called Gregory Schroeder to check-in and discuss scheduling a follow-up appointment, as Gregory Schroeder canceled the last appointment. Gregory Schroeder indicated things have been "stressful," but declined telepsychological services at this time. He also expressed uncertainty regarding continuing with the clinic in the future. A brief risk assessment was completed. Gregory Schroeder denied experiencing suicidal and homicidal ideation, plan, and intent since the last appointment with this provider.   PLAN: Gregory Schroeder was receptive to this provider's clinic calling him to check-in once in-person appointments resume.

## 2019-04-15 ENCOUNTER — Other Ambulatory Visit (INDEPENDENT_AMBULATORY_CARE_PROVIDER_SITE_OTHER): Payer: Self-pay | Admitting: Physician Assistant

## 2019-04-15 DIAGNOSIS — E559 Vitamin D deficiency, unspecified: Secondary | ICD-10-CM

## 2019-08-15 DIAGNOSIS — E039 Hypothyroidism, unspecified: Secondary | ICD-10-CM | POA: Diagnosis not present

## 2019-08-15 DIAGNOSIS — Z23 Encounter for immunization: Secondary | ICD-10-CM | POA: Diagnosis not present

## 2019-08-15 DIAGNOSIS — Z0000 Encounter for general adult medical examination without abnormal findings: Secondary | ICD-10-CM | POA: Diagnosis not present

## 2019-08-15 DIAGNOSIS — Z131 Encounter for screening for diabetes mellitus: Secondary | ICD-10-CM | POA: Diagnosis not present

## 2019-08-15 DIAGNOSIS — Z8249 Family history of ischemic heart disease and other diseases of the circulatory system: Secondary | ICD-10-CM | POA: Diagnosis not present

## 2019-08-15 DIAGNOSIS — Z125 Encounter for screening for malignant neoplasm of prostate: Secondary | ICD-10-CM | POA: Diagnosis not present

## 2019-08-15 DIAGNOSIS — Z13220 Encounter for screening for lipoid disorders: Secondary | ICD-10-CM | POA: Diagnosis not present

## 2019-09-12 ENCOUNTER — Other Ambulatory Visit: Admit: 2019-09-12 | Discharge: 2019-09-12 | Payer: MEDICARE

## 2019-09-12 DIAGNOSIS — F3110 Bipolar disorder, current episode manic without psychotic features, unspecified: Secondary | ICD-10-CM

## 2019-09-12 LAB — COMPREHENSIVE METABOLIC PANEL
ALT - Alanine Aminotransferase: 22 IU/L (ref 7–52)
AST - Aspartate Aminotransferase: 15 IU/L (ref 10–50)
Albumin/Globulin Ratio: 1.5 (ref >0.9)
Albumin: 4.5 g/dL (ref 3.5–5.0)
Alkaline Phosphatase: 82 IU/L (ref 34–104)
Anion Gap: 11 mmol/L (ref 4–13)
BUN: 15 mg/dL (ref 6–23)
Bilirubin Total: 0.4 mg/dL (ref 0.3–1.2)
CO2 - Carbon Dioxide: 23 mmol/L (ref 21–31)
Calcium: 10 mg/dL (ref 8.6–10.3)
Chloride: 104 mmol/L (ref 98–111)
Creatinine: 0.99 mg/dL (ref 0.65–1.30)
GFR Estimate Male: >60 mL/min/{1.73_m2} (ref >=60)
Globulin: 3 g/dL (ref 2.2–3.7)
Glucose: 108 mg/dL — ABNORMAL HIGH (ref 80–99)
Potassium: 4.3 mmol/L (ref 3.5–5.1)
Protein Total: 7.5 g/dL (ref 6.0–8.0)
Sodium: 138 mmol/L (ref 135–143)

## 2019-09-12 LAB — TESTOSTERONE, TOTAL AND FREE, SERUM (MAYO)
Testosterone, Free: 6.71 ng/dL (ref 4.06–15.6)
Testosterone, Total: 373 ng/dL (ref 240–950)

## 2019-09-12 LAB — CORONARY RISK LIPID PANEL
Cholesterol, HDL: 33 mg/dL — ABNORMAL LOW (ref >=40)
Cholesterol: 214 mg/dL — ABNORMAL HIGH (ref <200)
LDL Calculated: 130 mg/dL — ABNORMAL HIGH (ref <100)
Triglyceride: 253 mg/dL — ABNORMAL HIGH (ref 30–149)

## 2019-09-12 LAB — 25-OH HYDROXY VITAMIN D, CALCIFEROL, TOTAL OF D2 & D3: Vitamin D, 25-OH Total: 21.6 ng/mL — ABNORMAL LOW (ref 30.0–100.0)

## 2019-12-07 DIAGNOSIS — Z1159 Encounter for screening for other viral diseases: Secondary | ICD-10-CM | POA: Diagnosis not present

## 2019-12-12 DIAGNOSIS — Z1211 Encounter for screening for malignant neoplasm of colon: Secondary | ICD-10-CM | POA: Diagnosis not present

## 2019-12-12 DIAGNOSIS — D123 Benign neoplasm of transverse colon: Secondary | ICD-10-CM | POA: Diagnosis not present

## 2019-12-12 DIAGNOSIS — D122 Benign neoplasm of ascending colon: Secondary | ICD-10-CM | POA: Diagnosis not present

## 2019-12-12 DIAGNOSIS — D125 Benign neoplasm of sigmoid colon: Secondary | ICD-10-CM | POA: Diagnosis not present

## 2019-12-12 DIAGNOSIS — K5730 Diverticulosis of large intestine without perforation or abscess without bleeding: Secondary | ICD-10-CM | POA: Diagnosis not present

## 2019-12-12 DIAGNOSIS — K648 Other hemorrhoids: Secondary | ICD-10-CM | POA: Diagnosis not present

## 2020-01-23 ENCOUNTER — Other Ambulatory Visit: Admit: 2020-01-23 | Discharge: 2020-01-23 | Payer: MEDICARE

## 2020-01-23 DIAGNOSIS — E559 Vitamin D deficiency, unspecified: Secondary | ICD-10-CM

## 2020-01-23 LAB — COMPREHENSIVE METABOLIC PANEL
ALT - Alanine Aminotransferase: 23 IU/L (ref 7–52)
AST - Aspartate Aminotransferase: 18 IU/L (ref 10–50)
Albumin/Globulin Ratio: 1.7 (ref >0.9)
Albumin: 4.7 g/dL (ref 3.5–5.0)
Alkaline Phosphatase: 68 IU/L (ref 34–104)
Anion Gap: 12 mmol/L (ref 4–13)
BUN: 13 mg/dL (ref 6–23)
Bilirubin Total: 0.4 mg/dL (ref 0.3–1.2)
CO2 - Carbon Dioxide: 25 mmol/L (ref 21–31)
Calcium: 10.3 mg/dL (ref 8.6–10.3)
Chloride: 102 mmol/L (ref 98–111)
Creatinine: 1 mg/dL (ref 0.65–1.30)
GFR Estimate Male: >60 mL/min/{1.73_m2} (ref >=60)
Globulin: 2.8 g/dL (ref 2.2–3.7)
Glucose: 109 mg/dL — ABNORMAL HIGH (ref 80–99)
Potassium: 4.3 mmol/L (ref 3.5–5.1)
Protein Total: 7.5 g/dL (ref 6.0–8.0)
Sodium: 139 mmol/L (ref 135–143)

## 2020-01-23 LAB — 25-OH HYDROXY VITAMIN D, CALCIFEROL, TOTAL OF D2 & D3: Vitamin D, 25-OH Total: 38.1 ng/mL (ref 30.0–100.0)

## 2020-01-23 LAB — CORONARY RISK LIPID PANEL
Cholesterol, HDL: 32 mg/dL — ABNORMAL LOW (ref >=40)
Cholesterol: 221 mg/dL — ABNORMAL HIGH (ref <200)
LDL Calculated: 133 mg/dL — ABNORMAL HIGH (ref <100)
Triglyceride: 281 mg/dL — ABNORMAL HIGH (ref 30–149)

## 2020-01-23 LAB — GLYCO-HEMOGLOBIN A1C
Estimated Average Glucose: 108 mg/dL
Glycohemoglobin (A1c): 5.4 % (ref 4.3–6.1)

## 2020-01-23 LAB — ESTRADIOL: Estradiol: <15 pg/mL (ref <31.6)

## 2020-01-23 LAB — TESTOSTERONE, TOTAL AND FREE, SERUM (MAYO)
Testosterone, Free: 11.7 ng/dL (ref 4.06–15.6)
Testosterone, Total: 433 ng/dL (ref 240–950)

## 2020-01-23 LAB — DHEA-SULFATE PANEL: DHEA-Sulfate Panel: >1000 ug/dL — ABNORMAL HIGH (ref 70–495)

## 2020-08-20 DIAGNOSIS — E039 Hypothyroidism, unspecified: Secondary | ICD-10-CM | POA: Diagnosis not present

## 2020-08-20 DIAGNOSIS — Z0000 Encounter for general adult medical examination without abnormal findings: Secondary | ICD-10-CM | POA: Diagnosis not present

## 2020-10-04 ENCOUNTER — Other Ambulatory Visit: Admit: 2020-10-04 | Discharge: 2020-10-04 | Payer: MEDICARE

## 2020-10-04 DIAGNOSIS — E8881 Metabolic syndrome: Secondary | ICD-10-CM

## 2020-10-04 LAB — COMPREHENSIVE METABOLIC PANEL
ALT - Alanine Aminotransferase: 21 IU/L (ref 7–52)
AST - Aspartate Aminotransferase: 17 IU/L (ref 10–50)
Albumin/Globulin Ratio: 1.5 (ref >0.9)
Albumin: 4.6 g/dL (ref 3.5–5.0)
Alkaline Phosphatase: 72 IU/L (ref 34–104)
Anion Gap: 13 mmol/L (ref 4–13)
BUN: 13 mg/dL (ref 6–23)
Bilirubin Total: 0.4 mg/dL (ref 0.3–1.2)
CO2 - Carbon Dioxide: 22 mmol/L (ref 21–31)
Calcium: 10 mg/dL (ref 8.6–10.3)
Chloride: 104 mmol/L (ref 98–111)
Creatinine: 0.87 mg/dL (ref 0.65–1.30)
GFR Estimate Male: >60 mL/min/{1.73_m2} (ref >=60)
Globulin: 3.1 g/dL (ref 2.2–3.7)
Glucose: 120 mg/dL — ABNORMAL HIGH (ref 80–99)
Potassium: 4.5 mmol/L (ref 3.5–5.1)
Protein Total: 7.7 g/dL (ref 6.0–8.0)
Sodium: 139 mmol/L (ref 135–143)

## 2020-10-04 LAB — CORONARY RISK LIPID PANEL
Cholesterol, HDL: 37 mg/dL — ABNORMAL LOW (ref >=40)
Cholesterol: 211 mg/dL — ABNORMAL HIGH (ref <200)
LDL Calculated: 109 mg/dL — ABNORMAL HIGH (ref <100)
Triglyceride: 324 mg/dL — ABNORMAL HIGH (ref 30–149)

## 2020-10-04 LAB — MISC ARUP

## 2020-10-04 LAB — DHEA-SULFATE PANEL: DHEA-Sulfate Panel: 288 g/dL (ref 38–313)

## 2020-10-04 LAB — ESTRADIOL: Estradiol: 17 pg/mL (ref <31.6)

## 2020-11-21 DIAGNOSIS — Z20822 Contact with and (suspected) exposure to covid-19: Secondary | ICD-10-CM | POA: Diagnosis not present

## 2020-11-29 DIAGNOSIS — M79661 Pain in right lower leg: Secondary | ICD-10-CM | POA: Diagnosis not present

## 2020-12-05 ENCOUNTER — Other Ambulatory Visit: Payer: Self-pay | Admitting: Family Medicine

## 2020-12-05 ENCOUNTER — Emergency Department (HOSPITAL_BASED_OUTPATIENT_CLINIC_OR_DEPARTMENT_OTHER)
Admission: EM | Admit: 2020-12-05 | Discharge: 2020-12-05 | Disposition: A | Discharge status: Home / Self Care | Payer: BC Managed Care – PPO | Attending: Emergency Medicine | Admitting: Emergency Medicine

## 2020-12-05 ENCOUNTER — Other Ambulatory Visit: Payer: Self-pay

## 2020-12-05 ENCOUNTER — Ambulatory Visit (INDEPENDENT_AMBULATORY_CARE_PROVIDER_SITE_OTHER): Payer: BC Managed Care – PPO

## 2020-12-05 ENCOUNTER — Encounter (HOSPITAL_BASED_OUTPATIENT_CLINIC_OR_DEPARTMENT_OTHER): Payer: Self-pay

## 2020-12-05 DIAGNOSIS — Z79899 Other long term (current) drug therapy: Secondary | ICD-10-CM | POA: Diagnosis not present

## 2020-12-05 DIAGNOSIS — I82811 Embolism and thrombosis of superficial veins of right lower extremities: Secondary | ICD-10-CM

## 2020-12-05 DIAGNOSIS — I82401 Acute embolism and thrombosis of unspecified deep veins of right lower extremity: Secondary | ICD-10-CM | POA: Insufficient documentation

## 2020-12-05 DIAGNOSIS — I82451 Acute embolism and thrombosis of right peroneal vein: Secondary | ICD-10-CM | POA: Diagnosis not present

## 2020-12-05 DIAGNOSIS — I82431 Acute embolism and thrombosis of right popliteal vein: Secondary | ICD-10-CM | POA: Diagnosis not present

## 2020-12-05 DIAGNOSIS — M79661 Pain in right lower leg: Secondary | ICD-10-CM

## 2020-12-05 DIAGNOSIS — I82441 Acute embolism and thrombosis of right tibial vein: Secondary | ICD-10-CM | POA: Diagnosis not present

## 2020-12-05 DIAGNOSIS — Z87891 Personal history of nicotine dependence: Secondary | ICD-10-CM | POA: Insufficient documentation

## 2020-12-05 DIAGNOSIS — I82411 Acute embolism and thrombosis of right femoral vein: Secondary | ICD-10-CM | POA: Diagnosis not present

## 2020-12-05 DIAGNOSIS — F1590 Other stimulant use, unspecified, uncomplicated: Secondary | ICD-10-CM | POA: Insufficient documentation

## 2020-12-05 DIAGNOSIS — Z7901 Long term (current) use of anticoagulants: Secondary | ICD-10-CM | POA: Insufficient documentation

## 2020-12-05 DIAGNOSIS — M79604 Pain in right leg: Secondary | ICD-10-CM | POA: Diagnosis not present

## 2020-12-05 DIAGNOSIS — I824Y1 Acute embolism and thrombosis of unspecified deep veins of right proximal lower extremity: Secondary | ICD-10-CM

## 2020-12-05 DIAGNOSIS — E039 Hypothyroidism, unspecified: Secondary | ICD-10-CM | POA: Diagnosis not present

## 2020-12-05 LAB — BASIC METABOLIC PANEL
Anion gap: 11 (ref 5–15)
BUN: 17 mg/dL (ref 6–20)
CO2: 22 mmol/L (ref 22–32)
Calcium: 8.9 mg/dL (ref 8.9–10.3)
Chloride: 105 mmol/L (ref 98–111)
Creatinine, Ser: 0.99 mg/dL (ref 0.61–1.24)
GFR, Estimated: >60 mL/min (ref >60)
Glucose, Bld: 98 mg/dL (ref 70–99)
Potassium: 4 mmol/L (ref 3.5–5.1)
Sodium: 138 mmol/L (ref 135–145)

## 2020-12-05 LAB — CBC
HCT: 44.9 % (ref 39.0–52.0)
Hemoglobin: 15 g/dL (ref 13.0–17.0)
MCH: 29.8 pg (ref 26.0–34.0)
MCHC: 33.4 g/dL (ref 30.0–36.0)
MCV: 89.3 fL (ref 80.0–100.0)
Platelets: 244 10*3/uL (ref 150–400)
RBC: 5.03 MIL/uL (ref 4.22–5.81)
RDW: 13.4 % (ref 11.5–15.5)
WBC: 8.6 10*3/uL (ref 4.0–10.5)
nRBC: 0 % (ref 0.0–0.2)

## 2020-12-05 MED ORDER — APIXABAN 2.5 MG PO TABS
10.0000 mg | ORAL_TABLET | Freq: Two times a day (BID) | ORAL | Status: DC
  Administered 2020-12-05: 10 mg via ORAL
  Filled 2020-12-05: qty 4

## 2020-12-05 MED ORDER — APIXABAN (ELIQUIS) EDUCATION KIT FOR DVT/PE PATIENTS: PACK | Freq: Once | Status: DC

## 2020-12-05 MED ORDER — APIXABAN 5 MG PO TABS: ORAL_TABLET | ORAL | 0 refills | Status: AC

## 2020-12-05 MED ORDER — APIXABAN 2.5 MG PO TABS: 5.0000 mg | ORAL_TABLET | Freq: Two times a day (BID) | ORAL | Status: DC

## 2020-12-05 NOTE — Discharge Instructions (Addendum)
You were diagnosed with a blood clot in your right leg today.  Please take the medication Eliquis which is a blood thinner as directed.  Please make sure to follow-up with your primary care doctor within the week.  Return to the ER for any new or worsening symptoms including chest pain, shortness of breath.

## 2020-12-05 NOTE — Progress Notes (Signed)
ANTICOAGULATION CONSULT NOTE  Pharmacy Consult for Apixaban Indication: DVT  No Known Allergies  Patient Measurements: Height: 6\' 2"  (188 cm) Weight: (!) 147 kg (324 lb) IBW/kg (Calculated) : 82.2 Heparin Dosing Weight:   Vital Signs: Temp: 98.8 F (37.1 C) (12/15 1711) Temp Source: Oral (12/15 1711) BP: 162/95 (12/15 1711) Pulse Rate: 97 (12/15 1711)  Labs: Recent Labs    12/05/20 1725  HGB 15.0  HCT 44.9  PLT 244  CREATININE 0.99    Estimated Creatinine Clearance: 135 mL/min (by C-G formula based on SCr of 0.99 mg/dL).   Medical History: Past Medical History:  Diagnosis Date  . Back pain   . ED (erectile dysfunction)   . Foot pain   . Gallbladder problem   . GERD (gastroesophageal reflux disease)   . Hand cramps   . Hypothyroidism   . Joint pain   . Leg cramps   . Vitamin D deficiency     Medications:  Scheduled:  . apixaban   Does not apply Once  . apixaban  10 mg Oral BID   Followed by  . [START ON 12/12/2020] apixaban  5 mg Oral BID    Assessment: Patient is a 46 yom that presents to the ED with left leg pain x 10 days. Patient found to have a DVT and pharmacy has been asked to dose apixaban at this time.   Goal of Therapy:  Monitor platelets by anticoagulation protocol: Yes   Plan:  - Apixaban 10 mg PO BID x 7 days followed by Apixaban 5mg  PO BID   44 PharmD. BCPS 12/05/2020,5:50 PM

## 2020-12-05 NOTE — ED Triage Notes (Signed)
Pt c/o right LE x 10 day-dx DVT today- and sent to ED-NAD-steady gait

## 2020-12-05 NOTE — ED Provider Notes (Signed)
[place] EMERGENCY DEPARTMENT Provider Note   CSN: [id number] Arrival date & time: 12/05/20  1701     History Chief Complaint  Patient presents with  . Leg Pain    DVT    Gregory Schroeder is a 49 y.o. male.  HPI 49 year old male with a history of GERD, hypothyroidism, obesity presents to the ER with complaints of a diagnosed DVT in his right leg.  He was seen via telemedicine by his PCP on Friday, and they ordered a DVT study.  He had this done today and was told he has a blood clot.  Told to come to the ER.  He states he had Covid at the beginning of the month.  Denies any chest pain or shortness of breath.    Past Medical History:  Diagnosis Date  . Back pain   . ED (erectile dysfunction)   . Foot pain   . Gallbladder problem   . GERD (gastroesophageal reflux disease)   . Hand cramps   . Hypothyroidism   . Joint pain   . Leg cramps   . Vitamin D deficiency     Patient Active Problem List   Diagnosis Date Noted  . 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 Class 2 severe obesity with serious comorbidity and body mass index (BMI) of 37.0 to 37.9 in adult [facility]) 01/19/2019    Past Surgical History:  Procedure Laterality Date  . ACHILLES TENDON LENGTHENING    . CHOLECYSTECTOMY         Family History  Problem Relation Age of Onset  . Cancer Mother     Social History   Tobacco Use  . Smoking status: Former Smoker    Quit date: 2016    Years since quitting: 5.9  . Smokeless tobacco: Never Used  Vaping Use  . Vaping Use: Never used  Substance Use Topics  . Alcohol use: Yes    Comment: rare  . Drug use: Yes    Types: Marijuana    Home Medications Prior to Admission medications   Medication Sig Start Date End Date Taking? Authorizing Provider  apixaban (ELIQUIS) 5 MG TABS tablet Take 2 tablets ($RemoveBe'10mg'dofXOyUgH$ ) twice daily for 7 days, then 1 tablet ($RemoveB'5mg'EVkNlMXn$ ) twice daily 12/05/20   Garald Balding, PA-C  levothyroxine (SYNTHROID, LEVOTHROID) 175 MCG tablet Take  175 mcg by mouth daily before breakfast.    [provider]  Magnesium 100 MG CAPS Take by mouth.    [provider]  Vitamin D, Ergocalciferol, (DRISDOL) 1.25 MG (50000 UT) CAPS capsule Take 1 capsule (50,000 Units total) by mouth every 7 (seven) days. 03/02/19   Abby Potash, PA-C    Allergies    Patient has no known allergies.  Review of Systems   Review of Systems  Respiratory: Negative for shortness of breath.   Cardiovascular: Positive for leg swelling. Negative for chest pain.    Physical Exam Updated Vital Signs BP 137/90 (BP Location: Left Arm)   Pulse 86   Temp 98.8 F (37.1 C) (Oral)   Resp 20   Ht $R'6\' 2"'up$  (1.88 m)   Wt (!) 147 kg   SpO2 99%   BMI 41.60 kg/m   Physical Exam Vitals and nursing note reviewed.  Constitutional:      General: He is not in acute distress.    Appearance: He is well-developed and well-nourished. He is obese. He is not ill-appearing, toxic-appearing or diaphoretic.  HENT:     Head: Normocephalic and atraumatic.  Eyes:  Conjunctiva/sclera: Conjunctivae normal.  Cardiovascular:     Rate and Rhythm: Normal rate and regular rhythm.     Heart sounds: No murmur heard.   Pulmonary:     Effort: Pulmonary effort is normal. No respiratory distress.     Breath sounds: Normal breath sounds.  Abdominal:     Palpations: Abdomen is soft.     Tenderness: There is no abdominal tenderness.  Musculoskeletal:        General: Swelling present. No tenderness or signs of injury.     Cervical back: Neck supple.     Right lower leg: Edema present.     Left lower leg: No edema.     Comments: Right leg mildly erythematous, warm, more swollen than the right.  2+ DP pulses bilaterally. <2 cap refill  Skin:    General: Skin is warm and dry.     Findings: Erythema present. No rash.  Neurological:     Mental Status: He is alert.  Psychiatric:        Mood and Affect: Mood and affect normal.     ED Results / Procedures / Treatments    Labs (all labs ordered are listed, but only abnormal results are displayed) Labs Reviewed  BASIC METABOLIC PANEL  CBC    EKG None  Radiology US Venous Img Lower Unilateral Right (DVT)  Result Date: 12/05/2020 CLINICAL DATA:  Right lower extremity pain and edema for the past 10 days. Evaluate for DVT. EXAM: RIGHT LOWER EXTREMITY VENOUS DOPPLER ULTRASOUND TECHNIQUE: Gray-scale sonography with graded compression, as well as color Doppler and duplex ultrasound were performed to evaluate the lower extremity deep venous systems from the level of the common femoral vein and including the common femoral, femoral, profunda femoral, popliteal and calf veins including the posterior tibial, peroneal and gastrocnemius veins when visible. The superficial great saphenous vein was also interrogated. Spectral Doppler was utilized to evaluate flow at rest and with distal augmentation maneuvers in the common femoral, femoral and popliteal veins. COMPARISON:  None. FINDINGS: Contralateral Common Femoral Vein: Respiratory phasicity is normal and symmetric with the symptomatic side. No evidence of thrombus. Normal compressibility. Common Femoral Vein: No evidence of thrombus. Normal compressibility, respiratory phasicity and response to augmentation. Saphenofemoral Junction: No evidence of thrombus. Normal compressibility and flow on color Doppler imaging. Profunda Femoral Vein: No evidence of thrombus. Normal compressibility and flow on color Doppler imaging. Femoral Vein: There is mixed echogenic occlusive thrombus involving the proximal (image 17), mid (image 24) and distal (image 28) aspects of the right femoral vein. Popliteal Vein: There is hypoechoic occlusive thrombus involving the right popliteal vein (images 31 and 33. Calf Veins: Occlusive DVT involving both paired segments of the right posterior tibial vein (image 39 as well as the right peroneal vein (image 43). Superficial Great Saphenous Vein: No evidence  of thrombus. Normal compressibility. Venous Reflux:  None. Other Findings: There is hypoechoic occlusive thrombus involving the right lesser saphenous vein (image 37. IMPRESSION: 1. The examination is positive for occlusive DVT extending from the proximal aspect the right femoral vein through the imaged tibial veins. 2. Examination is also positive for occlusive SVT involving the interrogated portions of the right lesser saphenous vein at the level of the right calf. Electronically Signed   By: Sandi Mariscal M.D.   On: 12/05/2020 16:24    Procedures Procedures (including critical care time)  Medications Ordered in ED Medications  apixaban [facility]) Education Kit for DVT/PE patients (has no administration in time range)  apixaban (ELIQUIS) tablet 10 mg (10 mg Oral Given 12/05/20 1803)    Followed by  apixaban (ELIQUIS) tablet 5 mg (has no administration in time range)    ED Course  I have reviewed the triage vital signs and the nursing notes.  Pertinent labs & imaging results that were available during my care of the patient were reviewed by me and considered in my medical decision making (see chart for details).    MDM Rules/Calculators/A&P                          49 year old male who presents with positive DVT ultrasound.  No chest pain or shortness of breath.  Low suspicion for PE.  Vitals reassuring.  Labs checked, normal creatinine.  Will start on Eliquis.  Encouraged PCP follow-up.  Return precautions discussed.  He voiced understanding and is agreeable. Final Clinical Impression(s) / ED Diagnoses Final diagnoses:  Acute deep vein thrombosis (DVT) of proximal vein of right lower extremity [facility])    Rx / DC Orders ED Discharge Orders         Ordered    apixaban (ELIQUIS) 5 MG TABS tablet        12/05/20 1808           Garald Balding, PA-C 12/05/20 [place], Dan, DO 12/05/20 2057

## 2020-12-05 NOTE — ED Notes (Signed)
Review D/C papers with pt, reviewed Rx with pt, pt states understanding, pt denies questions at this time. 

## 2020-12-10 DIAGNOSIS — I82411 Acute embolism and thrombosis of right femoral vein: Secondary | ICD-10-CM | POA: Diagnosis not present

## 2021-01-24 DIAGNOSIS — E039 Hypothyroidism, unspecified: Secondary | ICD-10-CM | POA: Diagnosis not present

## 2021-01-24 DIAGNOSIS — I82561 Chronic embolism and thrombosis of right calf muscular vein: Secondary | ICD-10-CM | POA: Diagnosis not present

## 2021-01-24 DIAGNOSIS — Z13220 Encounter for screening for lipoid disorders: Secondary | ICD-10-CM | POA: Diagnosis not present

## 2021-01-24 DIAGNOSIS — R202 Paresthesia of skin: Secondary | ICD-10-CM | POA: Diagnosis not present

## 2021-09-30 DIAGNOSIS — M25551 Pain in right hip: Secondary | ICD-10-CM | POA: Diagnosis not present

## 2021-09-30 DIAGNOSIS — N529 Male erectile dysfunction, unspecified: Secondary | ICD-10-CM | POA: Diagnosis not present

## 2022-02-03 DIAGNOSIS — E039 Hypothyroidism, unspecified: Secondary | ICD-10-CM | POA: Diagnosis not present

## 2022-02-03 DIAGNOSIS — Z13220 Encounter for screening for lipoid disorders: Secondary | ICD-10-CM | POA: Diagnosis not present

## 2022-02-03 DIAGNOSIS — Z125 Encounter for screening for malignant neoplasm of prostate: Secondary | ICD-10-CM | POA: Diagnosis not present

## 2022-02-03 DIAGNOSIS — Z0000 Encounter for general adult medical examination without abnormal findings: Secondary | ICD-10-CM | POA: Diagnosis not present

## 2022-03-12 IMAGING — US US EXTREM LOW VENOUS*R*
1 series · 13 of 24 positions shown · non-contrast
Comparison: None.

CLINICAL DATA: Right lower extremity pain and edema for the past 10
days. Evaluate for DVT.



[Series 1: us extrem low venous*right* · 0.11mm/px · 13 of 45 slices shown]
[im 1/45]
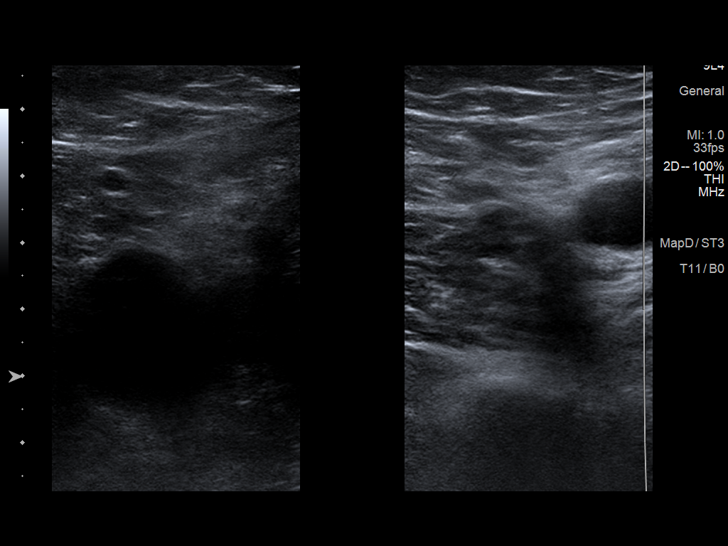
[im 4/45]
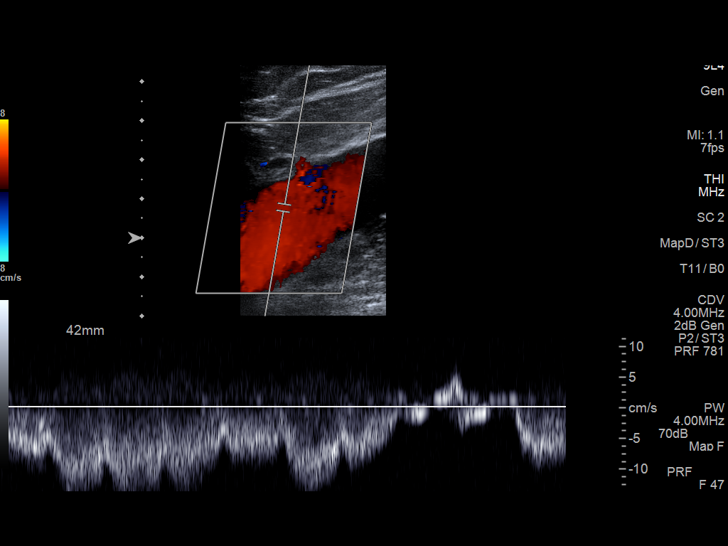
[im 8/45]
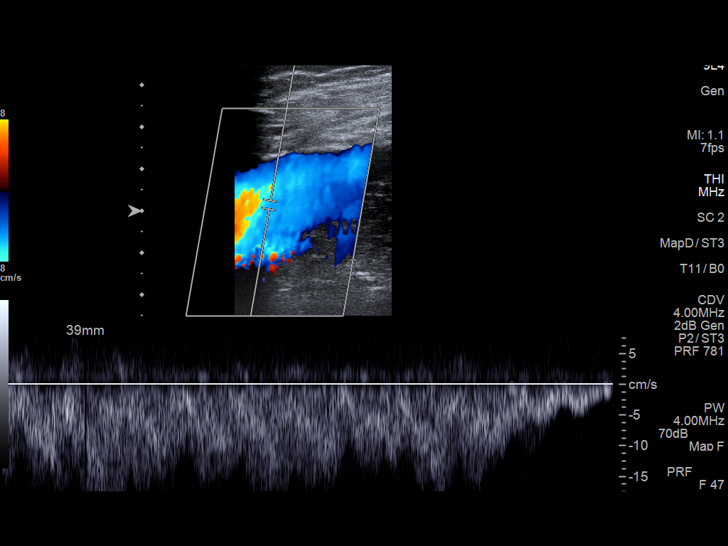
[im 12/45]
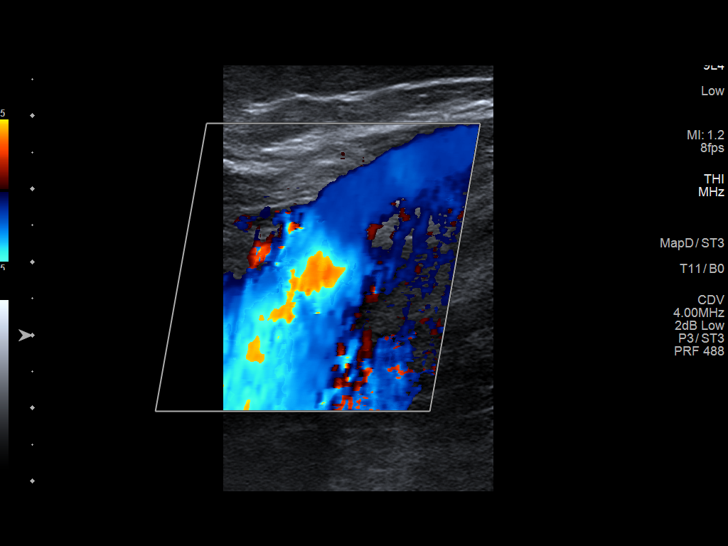
[im 16/45]
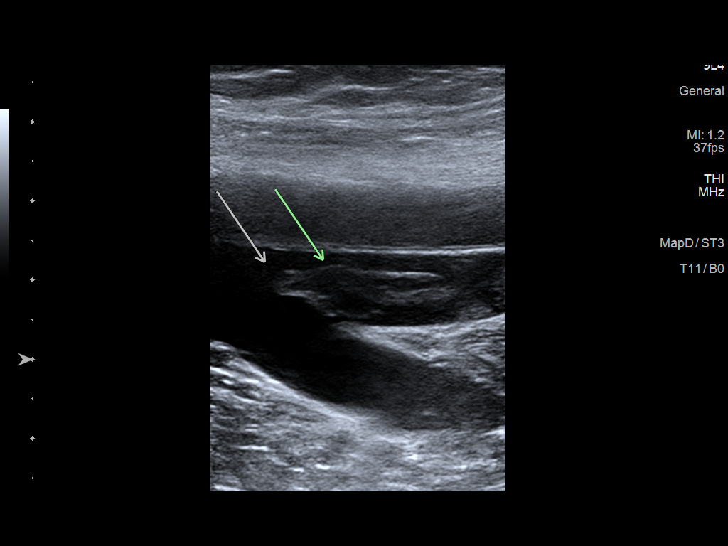
[im 20/45]
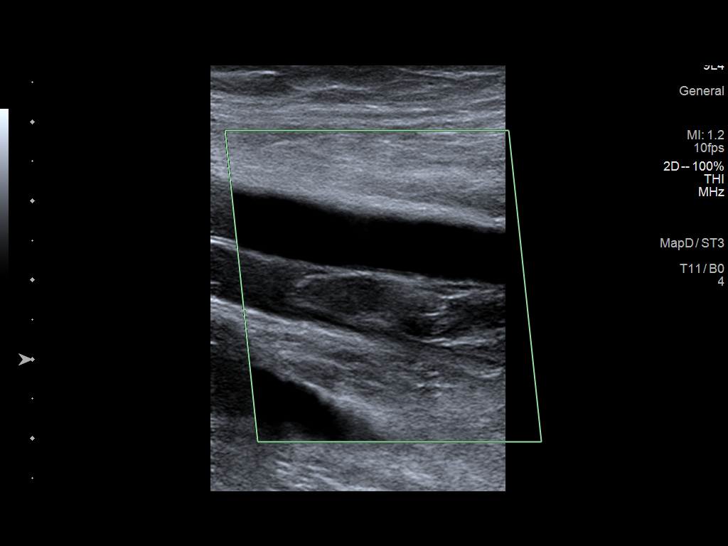
[im 23/45]
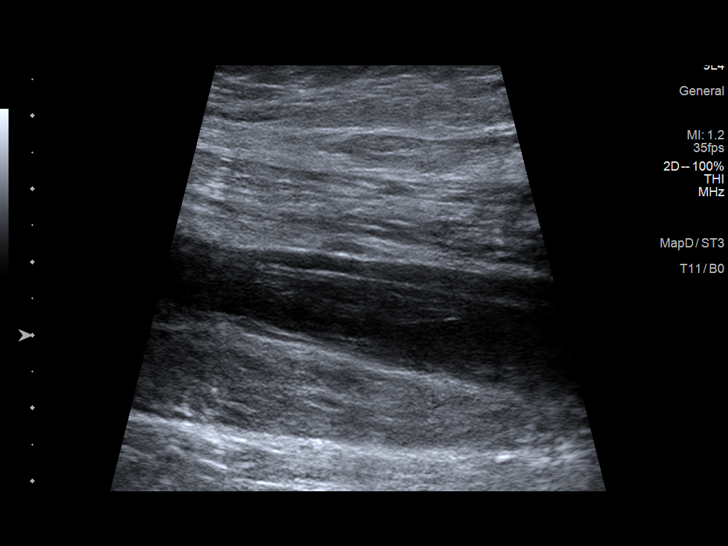
[im 25/45]
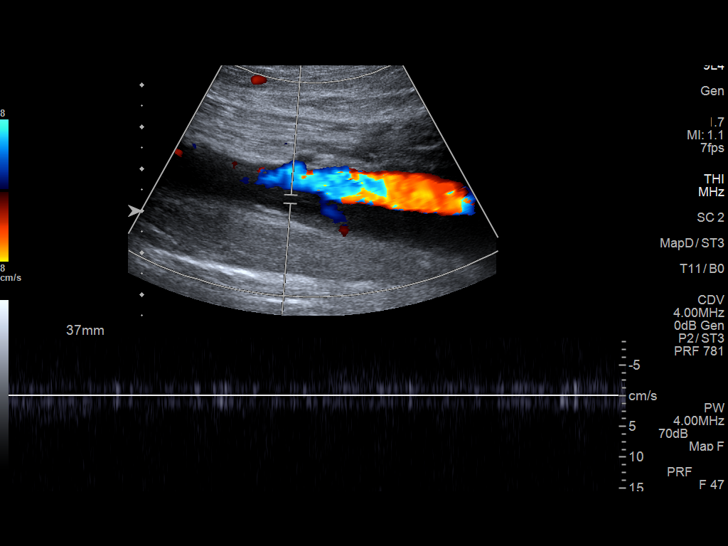
[im 29/45]
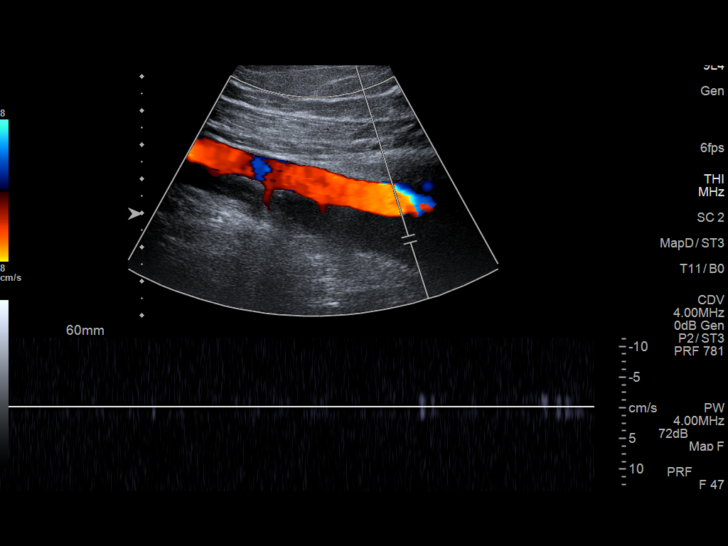
[im 33/45]
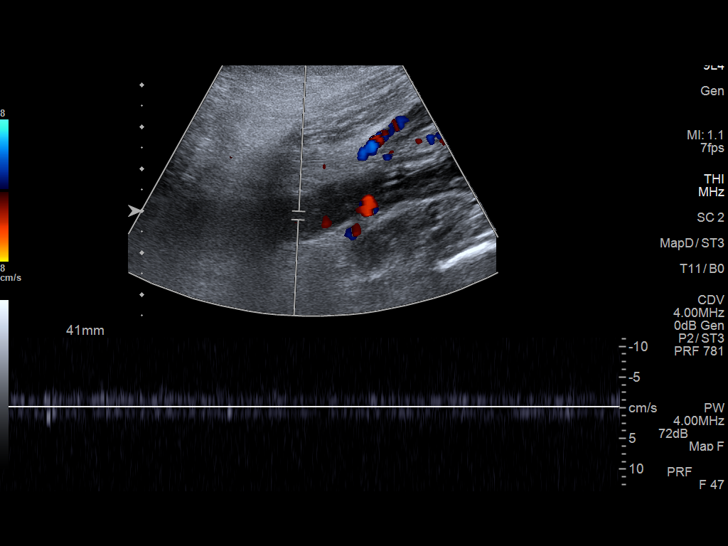
[im 37/45]
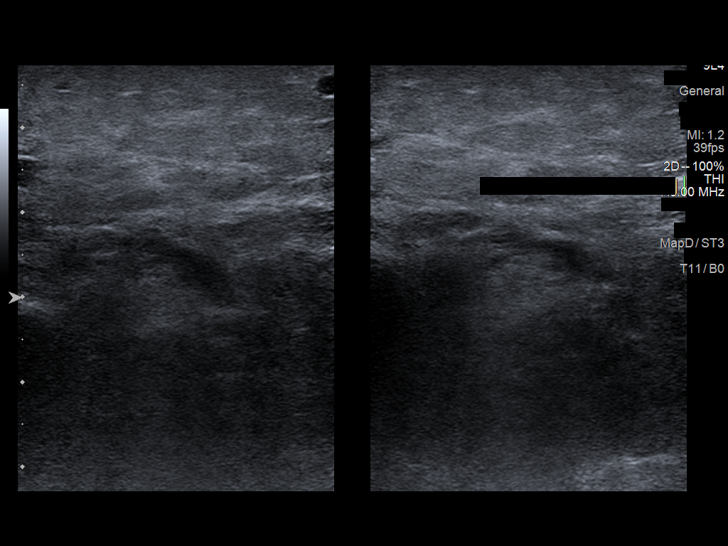
[im 41/45]
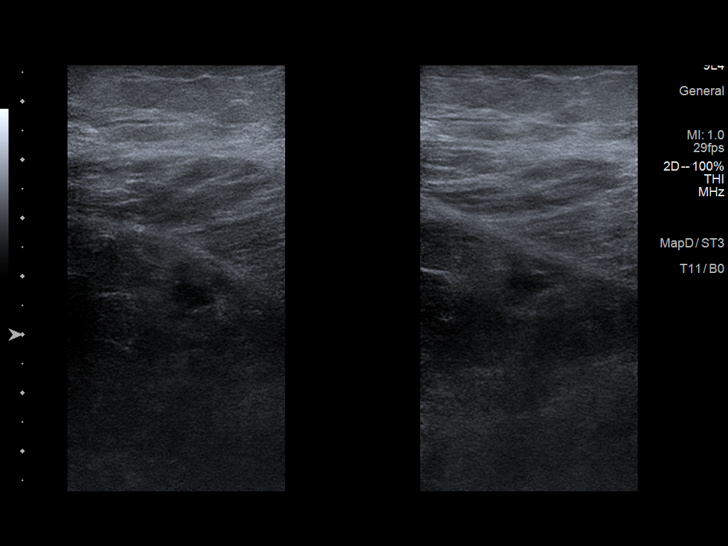
[im 45/45]
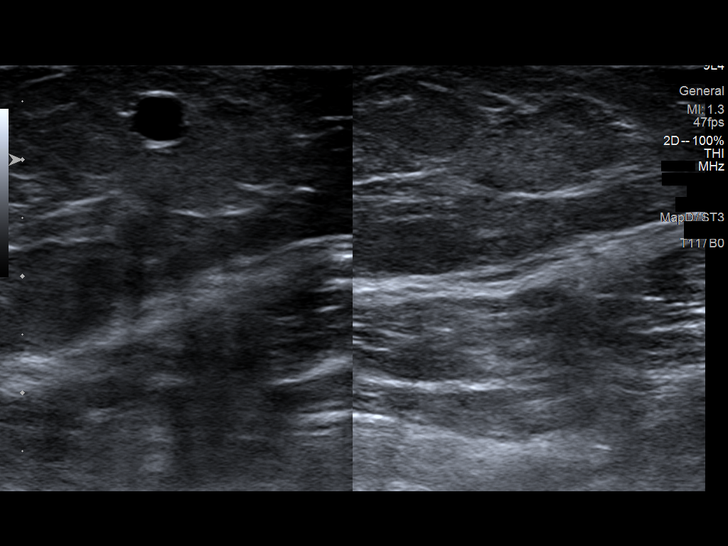

[13 of 24 positions shown; findings below may reference images not displayed]

FINDINGS: Contralateral Common Femoral Vein: Respiratory phasicity is normal
and symmetric with the symptomatic side. No evidence of thrombus.
Normal compressibility.

Common Femoral Vein: No evidence of thrombus. Normal
compressibility, respiratory phasicity and response to augmentation.

Saphenofemoral Junction: No evidence of thrombus. Normal
compressibility and flow on color Doppler imaging.

Profunda Femoral Vein: No evidence of thrombus. Normal
compressibility and flow on color Doppler imaging.

Femoral Vein: There is mixed echogenic occlusive thrombus involving
the proximal (image 17), mid (image 24) and distal (image 28)
aspects of the right femoral vein.

Popliteal Vein: There is hypoechoic occlusive thrombus involving the
right popliteal vein (images 31 and 33.

Calf Veins: Occlusive DVT involving both paired segments of the
right posterior tibial vein (image 39 as well as the right peroneal
vein (image 43).

Superficial Great Saphenous Vein: No evidence of thrombus. Normal
compressibility.

Venous Reflux:  None.

Other Findings: There is hypoechoic occlusive thrombus involving the
right lesser saphenous vein (image 37.
IMPRESSION: 1. The examination is positive for occlusive DVT extending from the
proximal aspect the right femoral vein through the imaged tibial
veins.
2. Examination is also positive for occlusive SVT involving the
interrogated portions of the right lesser saphenous vein at the
level of the right calf.

## 2022-03-25 ENCOUNTER — Ambulatory Visit: Admit: 2022-03-25 | Discharge: 2022-03-29 | Payer: MEDICARE | Attending: NP

## 2022-03-25 DIAGNOSIS — R350 Frequency of micturition: Secondary | ICD-10-CM

## 2022-03-25 LAB — POCT URINE
Bilirubin (Urine): NEGATIVE mg/dL
Blood (urine): NEGATIVE mg/dL
Glucose (Urine): 250 mg/dL — AB
Ketone (Urine): NEGATIVE mg/dL
Leukocyte (Urine): NEGATIVE mg/dL
Nitrites (Urine): NEGATIVE mg/dL
Protein (Urine): NEGATIVE mg/dL
Specific Gravity (Urine): 1.02 (ref 1.000–1.030)
Urobilinogen (Urine): 0.2 mg/dL
pH (Urine): 5.5 (ref 5.0–8.5)

## 2022-03-25 LAB — UA WITH AUTO MICRO
Ascorbic Acid, Urine: NEGATIVE
Bacteria, Urine: NONE SEEN /HPF
Bilirubin, Urine: NEGATIVE
Blood, Urine: NEGATIVE
Glucose, Urine: 150 mg/dL — AB
Hyaline Casts, Urine: 19 /LPF — ABNORMAL HIGH (ref <=5)
Ketones, Urine: NEGATIVE mg/dL
Leukocyte Esterase, Urine: NEGATIVE
Nitrite, Urine: NEGATIVE
Protein, Urine: NEGATIVE mg/dL
RBC, Urine: 0 /HPF (ref <=5)
Specific Gravity, Urine: 1.014 (ref 1.003–1.030)
Squamous Epithelial, Urine: 0 /HPF (ref <=5)
Urobilinogen, Urine: NORMAL mg/dL
White Blood Cell Microscopic Numeric: 1 /HPF (ref <=9)
pH, UA: 5 (ref 5.0–9.0)

## 2022-03-25 LAB — BORATE HOLD

## 2022-03-25 MED ORDER — tamsulosin (FLOMAX) 0.4 mg Cap
0.4 | ORAL_CAPSULE | Freq: Every evening | ORAL | 3 refills | 90.00000 days | Status: DC
Start: 2022-03-25 — End: 2023-06-22

## 2022-03-25 NOTE — Progress Notes (Signed)
Referring Physician:   Leana Gamer, MD    Chief Complaint:  LUTS         HISTORY OF PRESENT ILLNESS       51 y.o. 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 Symptoms are worse in the evenings. He takes prazosin 1 mg nightly. He is also on numerous antidepressants and antipsychotics including sertraline, trazadone, PRN seroquel, Latuda, lamictal, cariprazine, and bupropione. He takes cyclobenzaprine PRN. He was on topical TRT for low T but has not been on this for some time. He also has a history of kidney stones s/p lithotripsy with stent in 03/2014 (Dr. Daphine Deutscher). US-PVR of 29 mL and POC UA negative during encounter in 08/2021 during which he complained of dysuria, frequency, and sensation of incomplete bladder emptying. Denies dysuria, flank pain, gross hematuria, or recurrence of stone symptoms. No issues with constipation. Does avoid evening and nighttime fluids. Does have sleep apnea but is not consistent with CPAP use. Poor fluid intake; mostly drinks 60 oz. Diet [facility].      Most bothersome symptom is the constant urge to have to urinate especially before bedtime after just urinating 1 hour prior.     POC UA: glucosuria  PVR: 30 mL (obtained via bladder)  AUAss: 21 & 5    01/01/2022 POC UA negative  PSA 09/16/2021 1.1 with 16.4% free  01/23/2020 A1C 5.4%    Renal US 04/11/2014 PVR 9 mL. No hydro or stones          Over the past month, how often have you:  - Had a sensation of not emptying your bladder completely after you finished urinating?: Almost always  - Had to urinate again less than 2 hours after you finished urinating?: Almost always  - Stopped and started again several times when you urinated?: Not at all  - Found it difficult to postpone urination?: Less than half the time  - Had a weak urinary stream?: Almost always  - Had to push or strain to begin urination?:  About half the time  Over the past month, how many times did you:  - Most typically get up to urinate from the time you went to bed at night until the time you got up in the morning?: 5 or more times  AUA BPH SYMPTOM SCORE: 25  How would you feel if you had to live with your urinary condition the way it is now, no better, no worse, for the rest of your life?: Unhappy          Summary of Assessment, Impression and Plan:       1. Frequent urination  - tamsulosin (FLOMAX) 0.4 mg Cap; Take 1 capsule by mouth every night at bedtime.  Dispense: 90 capsule; Refill: 3    2. Nocturia  - tamsulosin (FLOMAX) 0.4 mg Cap; Take 1 capsule by mouth every night at bedtime.  Dispense: 90 capsule; Refill: 3    3. Feeling of incomplete bladder emptying    4. Weak urinary stream  - tamsulosin (FLOMAX) 0.4 mg Cap; Take 1 capsule by mouth every night at bedtime.  Dispense: 90 capsule; Refill: 3    5. Urgency of urination      There are no discontinued medications.      51 y.o. male establishing care for LUTS for the past 6 months. Declined DRE, but given age, likely BPH. May also be caused  by bladder irritants, poor hydration, untreated sleep apnea, or medications (namely, antipsychotics).  Most bothersome symptoms include nocturia, sensation of incomplete bladder emptying (US-PVR is 30 mL), and frequency especially before bedtime.  He does cut back on fluids before bed.  Recommended wearing CPAP more consistently, increasing his hydration throughout the day, reducing his [facility] consumption especially in the afternoon and evenings, and starting tamsulosin nightly.  POC UA shows glucosuria; he denies diabetes.  We will send for microscopy to confirm, and if positive will recommend getting an A1c, as diabetes can also contribute to the symptoms. Can consider upper and lower tract imaging if symptoms do not improve with these interventions.    Follow up in 2-3 months to review symptom response to lifestyle modifications and tamsulosin  nightly.        Medical Decision Making and Time:      51 y.o. male with multiple chronic urologic illness(es) as noted above.      Time:    Total time spent either face-to-face or non-face-to-face on the day of the encounter caring for this patient was: visit based on complexity risk, including medication management    Past Medical History:   Diagnosis Date   . Bipolar affective disorder (HCC)    . Hypertension    . Kidney stone      Medications:   Outpatient Medications Marked as Taking for the 03/25/22 encounter (Office Visit) with Celene Skeen, NP   Medication Sig Dispense Refill   . HYDROcodone-acetaminophen (NORCO) 10-325 mg per tablet Take 1 tablet by mouth every 6 (six) hours as needed for Pain. 10 tablet 0   . meloxicam (MOBIC) 15 MG tablet No more than once daily by mouth for pain, 15 tablet 0   . QUEtiapine (SEROQUEL XR) 400 MG 24 hr tablet Take 400 mg by mouth nightly.     . traZODone (DESYREL) 100 MG tablet Take 100 mg by mouth nightly.       Allergies: Penicillins    Past Surgical History:   Procedure Laterality Date   . PROCEDURE N/A 03/24/2014    Procedure: CYSTOSCOPY; URETEROSCOPY; LASER HOLMIUM LITHOTRIPSY; STENT PLACEMENT;  Surgeon: Marton Redwood, MD;  Location: [facility] OR;  Service: Urology;  Laterality: N/A;     History reviewed. No pertinent family history.  Social History     Socioeconomic History   . Marital status: Divorced     Spouse name: Not on file   . Number of children: Not on file   . Years of education: Not on file   . Highest education level: Not on file   Occupational History   . Not on file   Tobacco Use   . Smoking status: Never   . Smokeless tobacco: Never   Substance and Sexual Activity   . Alcohol use: Yes     Alcohol/week: 1.0 - 4.0 standard drink     Types: 1 - 4 Cans of beer per week     Comment: 2-3 X/mo   . Drug use: Yes     Frequency: 1.0 times per week     Types: Marijuana   . Sexual activity: Yes     Partners: Female   Other Topics Concern   . Not on file   Social History  Narrative   . Not on file     Social Determinants of Health     Financial Resource Strain: Low Risk    . Difficulty of Paying Living Expenses: Not on file   .  Access to Reliable Phone: Not on file   Food Insecurity: Not on file   Transportation Needs: Not on file   Physical Activity: Not on file   Stress: Not on file   Social Connections: Not on file   Intimate Partner Violence: Unknown   . Fear of Current or Ex-Partner: Not on file   . Emotionally Abused: Not on file   . Physically Abused: Not on file   . Sexually Abused: Not on file   . Questions Apply to Other Individual: Not on file   Housing Stability: Not on file       Social History     Tobacco Use   Smoking Status Never   Smokeless Tobacco Never        Reviewed pertinent medical, surgical, family and social histories today with the patient.     REVIEW OF SYSTEMS     Review of Systems   Gastrointestinal: Negative for abdominal pain.   Genitourinary: Positive for frequency and urgency. Negative for dysuria, flank pain and hematuria.      12 point  AMA review of systems negative except as noted above.    PHYSICAL EXAM     Pulse 110   Ht 5' 8 (1.727 m)   Wt 230 lb (104.3 kg)   SpO2 98%   BMI 34.97 kg/m?   Body mass index is 34.97 kg/m?Marland Kitchen    Physical Exam  Vitals and nursing note reviewed.   Constitutional:       Appearance: Normal appearance.   HENT:      Head: Normocephalic.   Eyes:      Conjunctiva/sclera: Conjunctivae normal.   Cardiovascular:      Rate and Rhythm: Normal rate.   Pulmonary:      Effort: Pulmonary effort is normal.   Abdominal:      General: Abdomen is flat. There is no distension.   Genitourinary:     Comments: declines  Musculoskeletal:         General: Normal range of motion.      Cervical back: Normal range of motion.   Skin:     General: Skin is warm and dry.   Neurological:      Mental Status: He is alert and oriented to person, place, and time.   Psychiatric:         Mood and Affect: Mood normal.         Behavior: Behavior normal.          Thought Content: Thought content normal.         Judgment: Judgment normal.       Physical Exam   Genitourinary:    Genitourinary Comments: declines     Nursing note and vitals reviewed.      Labs:    Sodium   Date Value Ref Range Status   10/04/2020 139 135 - 143 mmol/L Final     Potassium   Date Value Ref Range Status   10/04/2020 4.5 3.5 - 5.1 mmol/L Final     Chloride   Date Value Ref Range Status   10/04/2020 104 98 - 111 mmol/L Final     CO2 - Carbon Dioxide   Date Value Ref Range Status   10/04/2020 22 21 - 31 mmol/L Final     Anion Gap   Date Value Ref Range Status   10/04/2020 13 4 - 13 mmol/L Final     Calcium   Date Value Ref Range Status   10/04/2020 10.0  8.6 - 10.3 mg/dL Final     BUN   Date Value Ref Range Status   10/04/2020 13 6 - 23 mg/dL Final     GFR Estimate   Date Value Ref Range Status   02/13/2018 >60 >=60 mL/min/1.51m*2 Final     Glucose   Date Value Ref Range Status   10/04/2020 120 (H) 80 - 99 mg/dL Final       Lab Results   Component Value Date    PROTEINUR 1+ (A) 10/04/2015       No results found for: PSA, PSAFREE, PSAFREEPCT, PSASCREENING    I reviewed current and past pertinent lab findings with the patient during today's visit.    Imaging:      I independently reviewed any of the following images and reports today and discussed the findings with the patient.    No results found.      The patient verbalized understanding of the plan and agrees to proceed as outlined above. All of his questions were answered to his satisfaction.  He has been asked to call with any questions or concerns or if his condition acutely worsens.      This document was created with the assistance of voice-to-text technology. Effort has been made to minimize transcription errors. Please allow for homonyms and other similar transcription errors.

## 2022-03-25 NOTE — Addendum Note (Signed)
Addended by: Celene Skeen on: 03/27/2022 04:18 PM     Modules accepted: Orders

## 2022-03-25 NOTE — Patient Instructions (Addendum)
Cut back on caffeine, especially after 1pm. Increase your hydrating fluids. Try a bladder irritant elimination diet (see below). Start taking tamsulosin nightly. Use your CPAP consistently to further reduce nighttime urination.    Some foods and drinks can irritate the bladder, such as those listed below. Look through this list and determine if there are drinks or food items that are relatively common in your day-to-day diet. Try to eliminate these items COMPLETELY for 1-2 weeks, then introduce ONE item back at a time. If your bladder or urinary symptoms worsen, then you know to avoid the item. If there is no difference in your symptoms, then you do not need to avoid the item. This list is not comprehensive; there may be other foods/drinks not listed here that you find can irritate your bladder. It is helpful to keep a food diary with symptom tracking to determine if there are other foods/drinks that bother you.    All alcoholic beverages, including champagne.  Apples.  Apple juice.  Bananas.  Beer.  Brewer's yeast.  Canned figs.  [place].  Carbonated drinks.  Cheese.  Chicken livers.  Chilies/spicy foods.  Chocolate.  Citrus fruits.  Coffee.  Corned beef.  Cranberries.  Fava beans.  Grapes.  Guava.  Lemon juice.  Lima beans.  Nuts -- hazelnuts (also called filberts), pecans and pistachios  Mayonnaise.  NutraSweet.T  Onions (raw).  Peaches.  Pickled herring.  Pineapple.  Plums.  Prunes.  Raisins.  Rye bread.  Saccharin.  Sour cream.  Soy sauce.  Strawberries.  Tea -- black or green, regular or decaffeinated, and herbal blends that contain black or green tea.  Tomatoes.  Vinegar.  Vitamins buffered with aspartame.  Yogurt.    The following is a list of bladder irritants that may be tolerable:    Alcohol or wines (only as flavoring or for cooking).  Almonds.  Apple juice.  Blueberries.  Coffee (acid-free kava) or highly roasted.  Extracts (brandy, rum, etc.).  Imitation sour cream.  Lentils.  Nuts -- almonds,  cashews and peanuts.  Onions (cooked).  Orange juice (reduced acid).  Pears.  Processed cheese (non-aged).  Shallots.  Spring water.  Strawberries (1/2 cup).  Sun tea (herbal, but no blends made with black or green tea).  Tomatoes (low acid).  White chocolate.  Wines (late harvest).  Zest of orange or limes.  Other foods not listed. (It is best to check with your provider.)

## 2022-03-31 NOTE — Telephone Encounter (Signed)
-----   Message from Celene Skeen, NP sent at 03/27/2022  4:18 PM PDT -----  Please inform patient his urinalysis showed glucose.  This is a sign of diabetes.  I would recommend getting an A1c blood lab, and if this is ultimately showing signs of prediabetes or diabetes, he needs to follow-up with his primary care provider to go over management.  Diabetes can be the cause of some of his symptoms.  He does not need to be fasting for this lab.  He [place] at [place] [facility] [place]  Thanks.

## 2022-03-31 NOTE — Telephone Encounter (Signed)
Lmom to have Richard Harrison return call to clinic    Please, reach out to MA to relay result message.

## 2022-04-02 NOTE — Telephone Encounter (Signed)
lmom relaying msg to Pt and to call if he had any questions going forward

## 2022-06-30 ENCOUNTER — Other Ambulatory Visit: Admit: 2022-06-30 | Discharge: 2022-06-30 | Payer: MEDICARE

## 2022-06-30 ENCOUNTER — Ambulatory Visit: Admit: 2022-06-30 | Discharge: 2022-07-04 | Payer: MEDICARE | Attending: NP

## 2022-06-30 DIAGNOSIS — R350 Frequency of micturition: Secondary | ICD-10-CM

## 2022-06-30 LAB — GLYCO-HEMOGLOBIN A1C
Estimated Average Glucose: 105 mg/dL
Glycohemoglobin (A1c): 5.3 % (ref 4.3–6.1)

## 2022-06-30 NOTE — Progress Notes (Signed)
HISTORY OF PRESENT ILLNESS       51 y.o. male with bipolar disorder, depression, hypogonadism on TRT, and obesity here for LUTS, including sensation of incomplete emptying, weak stream, and nocturia. Symptoms are worse in the evenings. He takes prazosin 1 mg nightly. He is also on numerous antidepressants and antipsychotics including sertraline, trazadone, PRN seroquel, Latuda, lamictal, cariprazine, and bupropione. He takes cyclobenzaprine PRN. He was on topical TRT for low T but has not been on this for some time. He also has a history of kidney stones s/p lithotripsy with stent in 03/2014 (Dr. Daphine Deutscher). US-PVR of 29 mL and POC UA negative during encounter in 08/2021 during which he complained of dysuria, frequency, and sensation of incomplete bladder emptying. Denies dysuria, flank pain, gross hematuria, or recurrence of stone symptoms. No issues with constipation. Does avoid evening and nighttime fluids. Does have sleep apnea but is not consistent with CPAP use. Poor fluid intake; mostly drinks 60 oz. Diet [facility].    ?  Most bothersome symptom is the constant urge to have to urinate especially before bedtime after just urinating 1 hour prior.     03/25/2022  Est care with NP Delford Field.   AUAss 21 & 5  Exam Findings: Declined.  POC UA glucosuria, US-PVR 30 mL  Plan: Declined DRE, but given age, likely BPH. May also be caused by bladder irritants, poor hydration, untreated sleep apnea, or medications (namely, antipsychotics). Recommended wearing CPAP more consistently, increasing his hydration throughout the day, reducing his [facility] consumption especially in the afternoon and evenings, and starting tamsulosin nightly. MicroUA, and if glucose positive will recommend getting an A1c, as diabetes can also contribute to the symptoms. Can consider upper and lower tract imaging if symptoms do not improve with these interventions     06/30/22  MicroUA at last visit positive for glucose; for former dx of DM.  Recommended A1C and if positive, f/u with PCP to manage, as this may be contributing to symptoms.  Flomax has significantly helped his symptoms. No bothersome side effects.  POC UA: unable to leave sample.  Plan: A1C today. Continue Flomax. F/u in 1 year.         Pertinent Labs/Imaging:  01/01/2022 POC UA negative  01/23/2020 A1C 5.4%    Renal US 04/11/2014 PVR 9 mL. No hydro or stones    PSAs (ng/mL):  09/16/2021 1.1 with 16.4% free    Urine Studies:  03/25/2022 micro UA glucose             Summary of Assessment, Impression and Plan:    1. Frequent urination    2. Nocturia    3. Feeling of incomplete bladder emptying      There are no discontinued medications.      51 y.o. male following up for LUTS.  Symptoms markedly improved with Flomax.  Urine in 03/2022 showed glucose.  He denies a former diagnosis of diabetes.  Recommended getting A1c to rule out diabetes as this can contribute to his symptoms.  If positive, will refer back to PCP to manage this.    Follow-up in April 2024.  If still doing well at that time we will have him follow-up with his PCP fo refills going forward.          The patient verbalized understanding of the plan and agrees to proceed as outlined above. All of his questions were answered to his satisfaction. He has been asked to call with any questions or concerns or if  his condition acutely worsens.    Past Medical History:   Diagnosis Date   . Bipolar affective disorder (HCC)    . Hypertension    . Kidney stone      Medications:   Outpatient Medications Marked as Taking for the 06/30/22 encounter (Office Visit) with Celene Skeen, NP   Medication Sig Dispense Refill   . HYDROcodone-acetaminophen (NORCO) 10-325 mg per tablet Take 1 tablet by mouth every 6 (six) hours as needed for Pain. 10 tablet 0   . meloxicam (MOBIC) 15 MG tablet No more than once daily by mouth for pain, 15 tablet 0   . QUEtiapine (SEROQUEL XR) 400 MG 24 hr tablet Take 400 mg by mouth nightly.     . tamsulosin (FLOMAX) 0.4 mg Cap  Take 1 capsule by mouth every night at bedtime. 90 capsule 3   . traZODone (DESYREL) 100 MG tablet Take 100 mg by mouth nightly.       Allergies: Penicillins    Past Surgical History:   Procedure Laterality Date   . PROCEDURE N/A 03/24/2014    Procedure: CYSTOSCOPY; URETEROSCOPY; LASER HOLMIUM LITHOTRIPSY; STENT PLACEMENT;  Surgeon: Marton Redwood, MD;  Location: [facility] OR;  Service: Urology;  Laterality: N/A;     History reviewed. No pertinent family history.    Social History     Tobacco Use   Smoking Status Never   Smokeless Tobacco Never   Vaping Use   Vaping Status Not on file        Reviewed pertinent past medical history, surgical history, family history and social history with patient.      Vitals:    06/30/22 1503   Pulse: 91   SpO2: 96%     Body mass index is 34.53 kg/m?Marland Kitchen    Physical Exam  Vitals and nursing note reviewed.   Constitutional:       Appearance: Normal appearance.   HENT:      Head: Normocephalic.   Eyes:      Conjunctiva/sclera: Conjunctivae normal.   Cardiovascular:      Rate and Rhythm: Normal rate.   Pulmonary:      Effort: Pulmonary effort is normal.   Abdominal:      General: Abdomen is flat. There is no distension.   Musculoskeletal:         General: Normal range of motion.      Cervical back: Normal range of motion.   Skin:     General: Skin is warm and dry.   Neurological:      Mental Status: He is alert and oriented to person, place, and time.   Psychiatric:         Mood and Affect: Mood normal.         Behavior: Behavior normal.         Thought Content: Thought content normal.         Judgment: Judgment normal.       Physical Exam   Nursing note and vitals reviewed.      Medical Decision Making and Time:      51 y.o. male with multiple chronic urologic illness(es) as noted above.    Time:    Total time spent either face-to-face or non-face-to-face on the day of the encounter caring for this patient was: 10 minutes      This document was created with the assistance of voice-to-text  technology. Effort has been made to minimize transcription errors. Please allow for homonyms and other similar transcription  errors.

## 2022-07-01 NOTE — Telephone Encounter (Addendum)
LMOM relaying the message below.     ----- Message from Celene Skeen, NP sent at 07/01/2022 11:34 AM PDT -----  Please inform patient of normal results.  A1c shows no evidence of diabetes or prediabetes at this time.  Thanks.

## 2023-02-05 DIAGNOSIS — E7800 Pure hypercholesterolemia, unspecified: Secondary | ICD-10-CM | POA: Diagnosis not present

## 2023-02-05 DIAGNOSIS — N529 Male erectile dysfunction, unspecified: Secondary | ICD-10-CM | POA: Diagnosis not present

## 2023-02-05 DIAGNOSIS — Z125 Encounter for screening for malignant neoplasm of prostate: Secondary | ICD-10-CM | POA: Diagnosis not present

## 2023-02-05 DIAGNOSIS — Z6841 Body Mass Index (BMI) 40.0 and over, adult: Secondary | ICD-10-CM | POA: Diagnosis not present

## 2023-02-05 DIAGNOSIS — E039 Hypothyroidism, unspecified: Secondary | ICD-10-CM | POA: Diagnosis not present

## 2023-02-05 DIAGNOSIS — Z0000 Encounter for general adult medical examination without abnormal findings: Secondary | ICD-10-CM | POA: Diagnosis not present

## 2023-02-05 DIAGNOSIS — Z1211 Encounter for screening for malignant neoplasm of colon: Secondary | ICD-10-CM | POA: Diagnosis not present

## 2023-02-05 DIAGNOSIS — G250 Essential tremor: Secondary | ICD-10-CM | POA: Diagnosis not present

## 2023-03-09 NOTE — Telephone Encounter (Signed)
LDVM informing pt we are needing to r/s his 04/02 appt due to BW being unavailable at his appt time.   Informed pt we have a few openings the week before or 2 weeks after his scheduled appt. Advised pt call us back ASAP as the spots are extremely limited.

## 2023-03-17 ENCOUNTER — Ambulatory Visit: Admit: 2023-03-17 | Discharge: 2023-03-21 | Payer: MEDICARE | Attending: NP

## 2023-03-17 DIAGNOSIS — R350 Frequency of micturition: Secondary | ICD-10-CM

## 2023-03-17 NOTE — Progress Notes (Signed)
HISTORY OF PRESENT ILLNESS       52 y.o. 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 Symptoms are worse in the evenings. He takes prazosin 1 mg nightly. He is also on numerous antidepressants and antipsychotics including sertraline, trazadone, PRN seroquel, Latuda, lamictal, cariprazine, and bupropione. He takes cyclobenzaprine PRN. He?was?on?topical TRT for low T?but has not been on this for some time. He also has a history of kidney stones s/p lithotripsy with stent in 03/2014 (Dr. Daphine Deutscher).?US-PVR of 29 mL and POC UA negative during encounter in 08/2021 during which he complained of dysuria, frequency, and sensation of incomplete bladder emptying.?Denies dysuria, flank pain, gross hematuria, or recurrence of stone symptoms. No issues with constipation. Does avoid evening and nighttime fluids. Does have sleep apnea but is not consistent with CPAP use. Poor fluid intake; mostly drinks 60 oz. Diet [facility]. ?  ?  Most bothersome symptom is the constant urge to have to urinate especially before bedtime after just urinating 1 hour prior.?  ?  03/25/2022  Est care with NP Delford Field.   AUAss 21 & 5  Exam Findings: Declined.  POC UA glucosuria, US-PVR 30 mL  Plan: Declined DRE, but given age, likely BPH. May also be caused by bladder irritants, poor hydration, untreated sleep apnea, or medications (namely, antipsychotics). Recommended wearing CPAP more consistently, increasing his hydration throughout the day, reducing his [facility] consumption especially in the afternoon and evenings, and starting tamsulosin nightly. MicroUA, and if glucose positive will recommend getting an A1c, as diabetes can also contribute to the symptoms. Can consider upper and lower tract imaging if symptoms do not improve with these interventions   ?  06/30/22  F/u with NP Delford Field.  MicroUA at last visit positive for glucose; for  former dx of DM. Recommended A1C and if positive, f/u with PCP to manage, as this may be contributing to symptoms.  Flomax has significantly helped his symptoms. No bothersome side effects.  POC UA: unable to leave sample.  Plan: A1C today. Continue Flomax. F/u in 1 year.   ?  03/17/23  F/u with NP Delford Field.  Symptoms are definitely improved, but till dissatisfied. Symptoms only occur at night. From 10 pm until 2 am will urinate about every 1-1.5 hours, 5 minutes later can feel the urge to go again, but nothing comes out. Then around 2 am it stops until he wakes up. Continues to drink [facility] up until 5 pm. Does not wear CPAP. Does limit fluids within 3 hours of bedtime.   AUAss 10 & mostly dissatisfied   US-PVR 26 mL  Plan: Behavioral modifications. CPAP control. Stop [facility] and all caffeine after 12 pm. F/u PRN.  ?  ?  Pertinent Labs/Imaging:  06/30/2022 A1C 5.3%  01/01/2022 POC UA negative  01/23/2020 A1C 5.4%  ?  Renal US 04/11/2014 PVR 9 mL. No hydro or stones  ?  PSAs (ng/mL):  09/16/2021 1.1 with 16.4% free  ?  Urine Studies:  03/25/2022 micro UA glucose                         Summary of Assessment, Impression and Plan:    1. Frequent urination    2. Nocturia    3. Feeling of incomplete bladder emptying    4. Weak urinary stream      There are no discontinued medications.      52 y.o.  male following up for LUTS between 10 pm and 2 am. Does continue to drink [facility] up until dinner time, and does not wear his CPAP. Has limited fluids before bed, and takes tamsulosin at bedtime. Will try taking tamsulosin earlier in the evening, stop [facility] after noon, and try to be more consistent with CPAP.    F/u PRN.            The patient verbalized understanding of the plan and agrees to proceed as outlined above. All of his questions were answered to his satisfaction. He has been asked to call with any questions or concerns or if his condition acutely worsens.    Past Medical History:   Diagnosis Date   .  Bipolar affective disorder (HCC)    . Hypertension    . Kidney stone      Medications:   Outpatient Medications Marked as Taking for the 03/17/23 encounter (Office Visit) with Celene Skeen, NP   Medication Sig Dispense Refill   . HYDROcodone-acetaminophen (NORCO) 10-325 mg per tablet Take 1 tablet by mouth every 6 (six) hours as needed for Pain. 10 tablet 0   . meloxicam (MOBIC) 15 MG tablet No more than once daily by mouth for pain, 15 tablet 0   . QUEtiapine (SEROQUEL XR) 400 MG 24 hr tablet Take 400 mg by mouth nightly.     . tamsulosin (FLOMAX) 0.4 mg Cap Take 1 capsule by mouth every night at bedtime. 90 capsule 3   . traZODone (DESYREL) 100 MG tablet Take 100 mg by mouth nightly.       Allergies: Penicillins    Past Surgical History:   Procedure Laterality Date   . PROCEDURE N/A 03/24/2014    Procedure: CYSTOSCOPY; URETEROSCOPY; LASER HOLMIUM LITHOTRIPSY; STENT PLACEMENT;  Surgeon: Marton Redwood, MD;  Location: [facility] OR;  Service: Urology;  Laterality: N/A;     History reviewed. No pertinent family history.    Social History     Tobacco Use   Smoking Status Never   Smokeless Tobacco Never        Reviewed pertinent past medical history, surgical history, family history and social history with patient.      Vitals:    03/17/23 1418   Pulse: 85   SpO2: 95%     Body mass index is 31.45 kg/m?Marland Kitchen    Physical Exam  Vitals and nursing note reviewed.   Constitutional:       Appearance: Normal appearance.   HENT:      Head: Normocephalic.   Eyes:      Conjunctiva/sclera: Conjunctivae normal.   Cardiovascular:      Rate and Rhythm: Normal rate.   Pulmonary:      Effort: Pulmonary effort is normal.   Abdominal:      General: Abdomen is flat. There is no distension.   Musculoskeletal:         General: Normal range of motion.      Cervical back: Normal range of motion.   Skin:     General: Skin is warm and dry.   Neurological:      Mental Status: He is alert and oriented to person, place, and time.   Psychiatric:         Mood and  Affect: Mood normal.         Behavior: Behavior normal.         Thought Content: Thought content normal.         Judgment: Judgment normal.  Physical Exam   Nursing note and vitals reviewed.      Medical Decision Making and Time:      52 y.o. male with multiple chronic urologic illness(es) as noted above.      Time:    Total time spent either face-to-face or non-face-to-face on the day of the encounter caring for this patient was: 15 minutes      This document was created with the assistance of voice-to-text technology. Effort has been made to minimize transcription errors. Please allow for homonyms and other similar transcription errors.

## 2023-03-23 NOTE — OR Nursing (Signed)
Called to verify patient appointment for upcoming endoscopic procedure. There was no answer and thus I left a message with the arrival time and location. I instructed this patient to call the clinic with any questions or concerns or to reschedule/cancel and provided the number

## 2023-03-24 ENCOUNTER — Encounter: Attending: NP

## 2023-04-11 ENCOUNTER — Emergency Department: Admit: 2023-04-11 | Payer: MEDICARE

## 2023-04-11 ENCOUNTER — Inpatient Hospital Stay
Admit: 2023-04-11 | Discharge: 2023-04-11 | Disposition: A | Discharge status: Home / Self Care | Payer: MEDICARE | Attending: NP

## 2023-04-11 DIAGNOSIS — K625 Hemorrhage of anus and rectum: Secondary | ICD-10-CM

## 2023-04-11 DIAGNOSIS — K921 Melena: Secondary | ICD-10-CM

## 2023-04-11 LAB — CBC WITH AUTO DIFFERENTIAL
Basophils %: 1 % (ref 0–2)
Basophils, Absolute: 0.1 10*3/ÂµL (ref 0.0–0.2)
Eosinophils %: 1 % (ref 0–7)
Eosinophils, Absolute: 0.1 10*3/ÂµL (ref 0.0–0.7)
HCT: 42.1 % (ref 42.0–54.0)
Hemoglobin: 14.6 g/dL (ref 12.0–18.0)
Lymphocytes %: 21 % — ABNORMAL LOW (ref 25–45)
Lymphocytes, Absolute: 2.3 10*3/ÂµL (ref 1.1–4.3)
MCH: 29.5 pg (ref 27.0–34.0)
MCHC: 34.7 g/dL (ref 32.0–36.0)
MCV: 84.9 fL (ref 81.0–99.0)
MPV: 7.6 fL (ref 7.4–10.4)
Monocytes %: 9 % (ref 0–12)
Monocytes, Absolute: 0.9 10*3/ÂµL (ref 0.0–1.2)
Neutrophils %: 69 % (ref 35–70)
Neutrophils, Absolute: 7.5 10*3/ÂµL — ABNORMAL HIGH (ref 1.6–7.3)
Platelet Count: 228 10*3/ÂµL (ref 150–400)
RBC: 4.96 10*6/ÂµL (ref 4.70–6.10)
RDW: 13.2 % (ref 11.5–14.5)
WBC: 10.9 10*3/ÂµL — ABNORMAL HIGH (ref 4.8–10.8)

## 2023-04-11 LAB — CREATININE ED
Creatinine: 1.12 mg/dL (ref 0.65–1.30)
Glomerular Filtration Rate Estimate (Male): >60 mL/min/{1.73_m2} (ref >=60)

## 2023-04-11 LAB — COMPREHENSIVE METABOLIC PANEL
ALT - Alanine Aminotransferase: 12 IU/L (ref 7–52)
AST - Aspartate Aminotransferase: 13 IU/L (ref 10–50)
Albumin/Globulin Ratio: 1.3 (ref >0.9)
Albumin: 4.1 g/dL (ref 3.5–5.0)
Alkaline Phosphatase: 59 IU/L (ref 34–104)
Anion Gap: 6 mmol/L (ref 4–13)
BUN: 16 mg/dL (ref 6–23)
Bilirubin Total: 0.4 mg/dL (ref 0.3–1.2)
CO2 - Carbon Dioxide: 26 mmol/L (ref 21–31)
Calcium: 9.5 mg/dL (ref 8.6–10.3)
Chloride: 106 mmol/L (ref 98–111)
Creatinine: 1.03 mg/dL (ref 0.65–1.30)
Globulin: 3.2 g/dL (ref 2.0–3.7)
Glomerular Filtration Rate Estimate (Male): >60 mL/min/{1.73_m2} (ref >=60)
Glucose: 90 mg/dL (ref 80–99)
Potassium: 4.5 mmol/L (ref 3.5–5.1)
Protein Total: 7.3 g/dL (ref 6.0–8.0)
Sodium: 138 mmol/L (ref 135–143)

## 2023-04-11 MED ORDER — HYDROmorphone (DILAUDID) injection syringe 0.5 mg
0.5 | Freq: Once | INTRAMUSCULAR | Status: AC
Start: 2023-04-11 — End: 2023-04-11
  Administered 2023-04-11: 17:00:00 0.5 mg via INTRAVENOUS

## 2023-04-11 MED ORDER — oxyCODONE-acetaminophen (PERCOCET) 5-325 mg per tablet
5-325 | ORAL_TABLET | Freq: Four times a day (QID) | ORAL | 0 refills | 28.00000 days | Status: AC | PRN
Start: 2023-04-11 — End: 2023-04-15

## 2023-04-11 MED ORDER — sodium chloride 0.9 % (NS) bolus 500 mL
Freq: Once | INTRAVENOUS | Status: AC
Start: 2023-04-11 — End: 2023-04-11
  Administered 2023-04-11 (×2): via INTRAVENOUS

## 2023-04-11 MED ORDER — iopamidoL (ISOVUE-370) 370 mg iodine /mL (76 %) injection 80 mL
370 | Freq: Once | INTRAVENOUS | Status: AC | PRN
Start: 2023-04-11 — End: 2023-04-11
  Administered 2023-04-11: 17:00:00 370 mL via INTRAVENOUS

## 2023-04-11 MED ORDER — NaCl 0.9 % (NS flush) syringe 10 mL
INTRAMUSCULAR | Status: DC | PRN
Start: 2023-04-11 — End: 2023-04-11

## 2023-04-11 MED FILL — HYDROMORPHONE 0.5 MG/0.5 ML INJECTION SYRINGE: 0.5 | INTRAMUSCULAR | Qty: 0.5

## 2023-04-11 NOTE — ED Provider Notes (Signed)
[facility] VISIT NOTE    Name: Richard Harrison  DOB: 1971-12-10 52 y.o.  MRN: [id number]  CSN: [id number]         Chief Complaint   Patient presents with   . Hemorrhoids       HPI:   52 y.o. male who presents to the ED complaining of possible hemorrhoids.  Patient states he has a history of hemorrhoids which have had to be removed in the past.  Patient states he struggles with chronic constipation and recently had a very painful and large bowel movement.  He states since that time he feels a bulge down around his anus and has been having significant pain.  He reports some intermittent bloody stools.  He was unable to sleep last night due to the pain.  Due to the above he presented to the emergency department for evaluation.  Patient denies fevers, chills, body aches, nausea, vomiting.  No abdominal pain.  Patient states he actually has an appointment with a surgeon at [facility] for colonoscopy in early May however he did not feel comfortable waiting for this appointment based on what is going on.    Review of Symptoms:   Refer to HPI     Past Medical History:   Past Medical History:   Diagnosis Date   . Bipolar affective disorder (HCC)    . Hypertension    . Kidney stone        Surgical History:   Past Surgical History:   Procedure Laterality Date   . PROCEDURE N/A 03/24/2014    Procedure: CYSTOSCOPY; URETEROSCOPY; LASER HOLMIUM LITHOTRIPSY; STENT PLACEMENT;  Surgeon: Marton Redwood, MD;  Location: [facility] OR;  Service: Urology;  Laterality: N/A;        Allergies:   Allergies   Allergen Reactions   . Penicillins        Medications:   Discharge Medication List as of 04/11/2023 10:45 AM      CONTINUE these medications which have NOT CHANGED    Details   buPROPion ER (WELLBUTRIN XL) 150 mg 24 hr tablet Take 150 mg by mouth once daily., Historical Med      lamoTRIgine (LAMICTAL) 150 MG tablet Take 1 tablet (150 mg total) by mouth nightly for 30 days., Starting Mon 10/08/2015, Until Sat 04/11/2023,  Print      prazosin (MINIPRESS) 1 MG capsule Take 3 capsules (3 mg total) by mouth nightly for 30 days., Starting Mon 10/08/2015, Until Sat 04/11/2023, Print      QUEtiapine (SEROQUEL XR) 400 MG 24 hr tablet Take 400 mg by mouth nightly., Until Discontinued, Historical Med      tamsulosin (FLOMAX) 0.4 mg Cap Take 1 capsule by mouth every night at bedtime., Starting Tue 03/25/2022, Normal      traZODone (DESYREL) 100 MG tablet Take 200 mg by mouth every night at bedtime., Historical Med      HYDROcodone-acetaminophen (NORCO) 10-325 mg per tablet Take 1 tablet by mouth every 6 (six) hours as needed for Pain., Starting 09/26/2015, Until Discontinued, Print      lithium (LITHOBID) 300 MG CR tablet Take 1 tablet (300 mg total) by mouth every 12 (twelve) hours for 30 days., Starting 10/08/2015, Until Wed 11/07/15, Print      meloxicam (MOBIC) 15 MG tablet No more than once daily by mouth for pain,, Print             Vital Signs:   Initial Vitals [04/11/23 0727]   BP 127/85  Pulse 89   Resp 18   Temp 37.2 ?C (99 ?F)   SpO2 97 %       Physical Exam:  General: Well-appearing adult male lying on the gurney.  He appears nontoxic and in no acute distress    Chest: Respirations are even and unlabored.  Lungs are clear.    Cardiovascular: Regular rate and rhythm.    Abdomen: Soft and nontender.  No guarding or rebound.  No masses.  Rectal exam was done with Ronald Pippins RN to standby.  There were no perianal masses or obvious hemorrhoids noted.  Patient was not able to tolerate a digital rectal exam due to significant pain.    Skin: Warm and well-perfused.    Psych: Normal mood and affect.    Differential:   Internal hemorrhoids, anal fissure, mass, perianal abscess, perirectal abscess, diverticulitis, colitis.    RN notes:   Reviewed and agreed.    Radiologic Studies:  CT pelvis with contrast   Final Result by Eileen Stanford, MD (04/20 1038)   EXAM:   CT PELVIS      EXAM DATE: 04/11/2023 10:18 AM      CLINICAL HISTORY: Rectal pain  and bloody stools.  Evaluate for perirectal    abscess.      COMPARISONS: None.      TECHNIQUE: Routine helical CT imaging was performed through the pelvis. IV    contrast: 80 cc Isovue-370. Enteric contrast: No. Reconstructions: Coronal    and sagittal.      In accordance with CT protocol optimization, one or more of the following    dose reduction techniques were utilized for this exam: automated exposure    control, adjustment of mA and/or KV based on patient size, or use of    iterative reconstructive technique.      FINDINGS:    Visualized Abdominal Organs: Normal.      Peritoneal Cavity/Bowel: Normal. No free fluid, free air or adenopathy. No    masses or acute inflammatory process. The appendix is well visualized and    normal.      Pelvic Organs: Normal. The bladder, rectum, and visualized pelvic organs    are within normal limits.      Vasculature: No aneurysms or other significant abnormality.      Bones: No significant abnormality.      Other: None.         IMPRESSION:         No anorectal abscess identified.      RADIA      Dictated By: Eileen Stanford MD 2023-04-11 10:37:40.69   Signed By: Eileen Stanford MD 2023-04-11 10:38:41.0   Transcribed By: Eileen Stanford 2023-04-11 95:62:13.086         SITE ID: [id number]   Patients are advised to discuss imaging findings and recommendations with    the ordering provider.      CT abdomen pelvis with IV contrast :   Final Result by Eileen Stanford, MD (04/20 0957)   EXAM:   CT ABDOMEN AND PELVIS      EXAM DATE: 04/11/2023 09:30 AM      CLINICAL HISTORY: Lower abdominal pain, rectal pain, bloody stools.  Eval    for diverticulitis, mass, perirectal abscess.      COMPARISONS: None      TECHNIQUE: Routine helical CT imaging was performed through the abdomen    and pelvis. IV contrast: 80 cc Isovue-370. Enteric contrast: No.    Reconstructions: Axial, coronal and sagittal.  In accordance with CT protocol optimization, one or more of the following    dose reduction  techniques were utilized for this exam: automated exposure    control, adjustment of mA and/or KV based on patient size, or use of    iterative reconstructive technique.      FINDINGS:    Lung Bases: No acute findings.      Liver: No focal abnormalities. No acute findings.      Gallbladder/Bile Ducts: No calcified gallstones. No intrahepatic or    extrahepatic biliary dilatation.      Spleen: Normal.      Pancreas: No focal abnormalities. No acute findings.      Adrenal Glands: No masses. No acute findings.      Kidneys: No hydronephrosis, renal calculi, or suspicious lesions.       Peritoneal Cavity/Bowel: No dilated loops of bowel. No free fluid. No    obvious inflammatory changes. Mild sigmoid diverticulosis. Please note,    rectum and anus are not completely included on this exam.      Appendix: No appendicitis.      Bladder: Unremarkable.       Reproductive organs: Normal for age.      Lymph nodes: No adenopathy by size criteria.      Vasculature: No aneurysm. No acute findings.      Bones: No acute findings. No suspicious lytic or blastic bony lesions.      Other: None.         IMPRESSION:      No acute findings in the abdomen or pelvis. Please note, anus and rectum    are not completely included on this exam.      Dictated By: Eileen Stanford MD 2023-04-11 09:56:00.41   Signed By: Eileen Stanford MD 2023-04-11 09:57:01.0   Transcribed By: Eileen Stanford 2023-04-11 09:57:01.877         SITE ID: [id number]   Patients are advised to discuss imaging findings and recommendations with    the ordering provider.          Labs:  Labs Reviewed   CBC WITH AUTO DIFFERENTIAL - Abnormal; Notable for the following components:       Result Value    WBC 10.9 (*)     Lymphocytes % 21 (*)     Neutrophils, Absolute 7.5 (*)     All other components within normal limits   RAINBOW DRAW [facility])    Narrative:     The following orders were created for panel order Rainbow Draw [facility] [address]) -STAT.  Procedure                               Abnormality          Status                     ---------                               -----------         ------                     Burna Mortimer Top (Short) .Marland KitchenMarland Kitchen[[id number]]  Light Green Top -[id number]                            Final result               Purple Top -[id number]                                 Final result                 Please view results for these tests on the individual orders.   COMPREHENSIVE METABOLIC PANEL   CREATININE ED   LIGHT GREEN TOP   PURPLE TOP       Meds:   Medications Administered       Date/Time Order Dose Route Action     04/11/2023 1106 PDT sodium chloride 0.9 % (NS) bolus 500 mL 0 mL Intravenous Stopped     04/11/2023 0942 PDT sodium chloride 0.9 % (NS) bolus 500 mL 500 mL Intravenous New Bag     04/11/2023 0942 PDT HYDROmorphone (DILAUDID) injection syringe 0.5 mg 0.5 mg Intravenous Given     04/11/2023 0931 PDT iopamidoL (ISOVUE-370) 370 mg iodine /mL (76 %) injection 80 mL 80 mL Intravenous Given     04/11/2023 1019 PDT iopamidoL (ISOVUE-370) 370 mg iodine /mL (76 %) injection 80 mL 80 mL Intravenous Given          Emergency Department Course/Medical Decision Making:  This is a 52 year old male who presents with rectal pain, bloody bowel movements.  He is concerned about a hemorrhoid.    On exam vital signs are stable.  There were no external perianal lesions or hemorrhoids.  He did have significant pain and was intolerant of a digital rectal exam.  At this time an IV was established and labs drawn.  He will be medicated for pain.  After conversation we will obtain a CT scan of his abdomen and pelvis to further evaluate as diverticulitis, colitis, mass, anorectal abscess is in the differential.    Labs are essentially unremarkable.  CT scan of his abdomen and pelvis was negative for acute abnormality unfortunately the scan did not go all the way through his rectum and anus and the radiologist was not able to get a clear view of this.   I did speak with the reading radiologist and after conversation we will obtain another CT scan of his pelvis to adequately evaluate.  Patient is comfortable proceeding with the second scan.  CT of his pelvis negative for signs of anorectal abnormality.  Patient is reassured.  Pain improved in the emergency department.  At this time I do not believe he requires further workup.  He actually has an appointment scheduled with a surgeon at [facility] in early May for a colonoscopy.  Patient was discharged with supportive care instructions and strict return precautions.    Plan:   Discharge    CLINICAL IMPRESSION:    SNOMED CT(R)   1. BRBPR (bright red blood per rectum)  GASTROINTESTINAL HEMORRHAGE              Discharge Medication List as of 04/11/2023 10:45 AM      START taking these medications    Details   oxyCODONE-acetaminophen (PERCOCET) 5-325 mg per tablet Take 1 tablet by mouth every 6 hours as needed for Pain for up to 8 doses., Starting Sat 04/11/2023, Until Wed 04/15/2023  at 2359, Normal               This dictation was created by voice recognition software. Despite concurrent proofreading, transcription error's are common and may not reflect the true intent of the reporter.      [place] Berline Lopes, NP  04/11/23 1635

## 2023-04-11 NOTE — ED Triage Notes (Signed)
Pt with hx of hemorrhoids had BM with pain and blood on 04/09/23.  Pt states hemorrhoid burst, has been unable to sleep r/t pain.

## 2023-04-11 NOTE — Discharge Instructions (Addendum)
I have been unable to identify an exact cause of your pain and bleeding however your CAT scan and blood work are reassuring.  Please follow-up with your regular doctor for reevaluation.  Keep hydrated and avoid constipation.  Take the pain medicine only for severe pain.

## 2023-04-11 NOTE — ED Notes (Signed)
Pt provided with discharge education, including the importance of receiving followup care. Pt verbalizes understanding. Pt discharged ambulatory with steady gait and all belongings in no apparent distress.

## 2023-04-21 NOTE — Progress Notes (Signed)
Patient is a 52 year old male who presents with chief concern of rectal bleeding  Onset two weeks ago.no injuries or strenous lifting. Possibly hemmorrhoids. Bleeding during a bowel movement and just after . Taking pain medication ( percocet) for that. Reports that percocet is not helping with pain., preparation h cream , pads and supporisitories. Patient reports nothing helps.  Patient reports that with each bowel movement, he gets 10/10 pain, which will last for about 3 hours and then gets back down to the constant 3.  He is having 1-2 bowel per day.  He is still having a moderate amount of blood in his stools now, but not as bad as before.  He uses a stool softener and takes fiber and this helps keep the bowel movements soft.  He has a colonoscopy a week from today at [place].      From ER VIsit 04/11/23:  Emergency Department Course/Medical Decision Making:  This is a 52 year old male who presents with rectal pain, bloody bowel movements. He is concerned about a hemorrhoid.    On exam vital signs are stable. There were no external perianal lesions or hemorrhoids. He did have significant pain and was intolerant of a digital rectal exam. At this time an IV was established and labs drawn. He will be medicated for pain. After conversation we will obtain a CT scan of his abdomen and pelvis to further evaluate as diverticulitis, colitis, mass, anorectal abscess is in the differential.    Labs are essentially unremarkable. CT scan of his abdomen and pelvis was negative for acute abnormality unfortunately the scan did not go all the way through his rectum and anus and the radiologist was not able to get a clear view of this. I did speak with the reading radiologist and after conversation we will obtain another CT scan of his pelvis to adequately evaluate. Patient is comfortable proceeding with the second scan. CT of his pelvis negative for signs of anorectal abnormality. Patient is reassured. Pain improved in the  emergency department. At this time I do not believe he requires further workup. He actually has an appointment scheduled with a surgeon at [facility] in early May for a colonoscopy. Patient was discharged with supportive care instructions and strict return precautions.     HTN  Does patient check BP at home? No  Any signs or symptoms of high or low blood pressure? No  Does patient take medication as prescribed? Yes  Any side effects from medication? No    BP 118/82 (Right Arm, Sitting, Regular Adult)  Pulse 71  Temp 98.2 ?F (36.8 ?C)  Wt 210 lb 12.8 oz (95.6 kg)  SpO2 98%  Smoking Status Never  Physical Exam:  Gen: NAD, A&O.  Head: NC/AT  Lungs: Nl effort.  Neuro: No focal deficits noted.  Nl gait.  Skin: No rashes noted on exposed skin.  Psych: Nl attention and affect.    Assessment and Plan:  K64.9 Bleeding hemorrhoid  (primary encounter diagnosis)  Plan : . OXYCODONE-ACETAMINOPHEN 7.5 MG-325 MG TABLET             - Take 1 Tablet by mouth every 6 (six) hours as            needed for pain            . DIBUCAINE 1 % RECTAL OINTMENT - Place             rectally 2 (two) times daily    K62.5 Rectal bleeding  Plan : . OXYCODONE-ACETAMINOPHEN 7.5 MG-325 MG TABLET             - Take 1 Tablet by mouth every 6 (six) hours as            needed for pain            . DIBUCAINE 1 % RECTAL OINTMENT - Place             rectally 2 (two) times daily    Patient with painful rectal bleeding.  Has follow up planned for one week with colorectal surgery for colonoscopy.  Will treat symptoms in the meantime as above.      CCMA discussed with patient the following health maintenance items due:    Health Maintenance Due   Topic Date Due   . Hepatitis C Screening  Never done- declined   . LTBI Screening (1) Never done   . Annual Wellness Visit (Medicare)  Never done   . HIV Screening  Never done   . Imm-Hepatitis B (1 of 3 - 19+ 3-dose series) Never done declined   . Imm-Zoster, Recombinant (1 of 2) Never done declined   . Colorectal  Cancer Screening  08/15/2020   . Imm-COVID-19 (1 - 2023-24 season) Never done declined   . Lipid Screening  04/24/2023   . Tobacco Screening  04/24/2023

## 2023-04-24 DIAGNOSIS — K6289 Other specified diseases of anus and rectum: Secondary | ICD-10-CM

## 2023-04-24 NOTE — ED Triage Notes (Addendum)
Rectal bleeding and severe pain. Hx of hemorrhoid. Was seen recently for same issue.

## 2023-04-25 ENCOUNTER — Inpatient Hospital Stay
Admit: 2023-04-25 | Discharge: 2023-04-25 | Disposition: A | Discharge status: Home / Self Care | Payer: MEDICARE | Attending: Student in an Organized Health Care Education/Training Program

## 2023-04-25 DIAGNOSIS — K6289 Other specified diseases of anus and rectum: Secondary | ICD-10-CM

## 2023-04-25 LAB — CBC WITH AUTO DIFFERENTIAL
Basophils %: 0 % (ref 0–2)
Basophils, Absolute: 0 10*3/ÂµL (ref 0.0–0.2)
Eosinophils %: 0 % (ref 0–7)
Eosinophils, Absolute: 0 10*3/ÂµL (ref 0.0–0.7)
HCT: 41.6 % — ABNORMAL LOW (ref 42.0–54.0)
Hemoglobin: 14.4 g/dL (ref 12.0–18.0)
Lymphocytes %: 37 % (ref 25–45)
Lymphocytes, Absolute: 3.4 10*3/ÂµL (ref 1.1–4.3)
MCH: 29.1 pg (ref 27.0–34.0)
MCHC: 34.7 g/dL (ref 32.0–36.0)
MCV: 83.9 fL (ref 81.0–99.0)
MPV: 7 fL — ABNORMAL LOW (ref 7.4–10.4)
Monocytes %: 9 % (ref 0–12)
Monocytes, Absolute: 0.9 10*3/ÂµL (ref 0.0–1.2)
Neutrophils %: 53 % (ref 35–70)
Neutrophils, Absolute: 4.8 10*3/ÂµL (ref 1.6–7.3)
Platelet Count: 219 10*3/ÂµL (ref 150–400)
RBC: 4.95 10*6/ÂµL (ref 4.70–6.10)
RDW: 13.1 % (ref 11.5–14.5)
WBC: 9.1 10*3/ÂµL (ref 4.8–10.8)

## 2023-04-25 LAB — COMPREHENSIVE METABOLIC PANEL
ALT - Alanine Aminotransferase: 14 IU/L (ref 7–52)
AST - Aspartate Aminotransferase: 13 IU/L (ref 10–50)
Albumin/Globulin Ratio: 1.3 (ref >0.9)
Albumin: 4.1 g/dL (ref 3.5–5.0)
Alkaline Phosphatase: 59 IU/L (ref 34–104)
Anion Gap: 9 mmol/L (ref 4–13)
BUN: 19 mg/dL (ref 6–23)
Bilirubin Total: 0.5 mg/dL (ref 0.3–1.2)
CO2 - Carbon Dioxide: 25 mmol/L (ref 21–31)
Calcium: 9.3 mg/dL (ref 8.6–10.3)
Chloride: 102 mmol/L (ref 98–111)
Creatinine: 0.98 mg/dL (ref 0.65–1.30)
Globulin: 3.2 g/dL (ref 2.0–3.7)
Glomerular Filtration Rate Estimate (Male): >60 mL/min/{1.73_m2} (ref >=60)
Glucose: 81 mg/dL (ref 80–99)
Potassium: 3.9 mmol/L (ref 3.5–5.1)
Protein Total: 7.3 g/dL (ref 6.0–8.0)
Sodium: 136 mmol/L (ref 135–143)

## 2023-04-25 LAB — ED INFORMATION EXCHANGE

## 2023-04-25 LAB — C-REACTIVE PROTEIN: CRP: 7.33 mg/L — ABNORMAL HIGH (ref <5.00)

## 2023-04-25 LAB — ERYTHROCYTE SEDIMENTATION RATE, AUTOMATED: Erythrocyte Sedimentation Rate, Automated: 4 mm/hr (ref 0–20)

## 2023-04-25 MED ORDER — morphine injection 6 mg
4 | Freq: Once | INTRAVENOUS | Status: AC
Start: 2023-04-25 — End: 2023-04-25
  Administered 2023-04-25: 10:00:00 4 mg via INTRAMUSCULAR

## 2023-04-25 MED FILL — MORPHINE 4 MG/ML INTRAVENOUS SYRINGE: 4 mg/mL | INTRAVENOUS | Qty: 2

## 2023-04-25 NOTE — ED Notes (Signed)
Venipuncture to right ac with blood draw.  Pt tolerated with no difficulty.  Samples sent to lab per order.

## 2023-04-25 NOTE — ED Provider Notes (Signed)
[facility] EMERGENCY DEPARTMENT VISIT NOTE    History and Physical     Name: Richard Harrison  DOB: [birth year] 52 y.o.  MRN: [id number]  CSN: [id number]    HISTORY:     CHIEF COMPLAINT    Rectal pain    HPI    Richard Harrison is a 52 y.o. male who presents today with chief complaint of ongoing rectal pain.  His symptoms have now been present for well over a month.  States that he has constant daily pain which is a sharp and stabbing pain to the perianal area.  He states that this is worse with bowel movements however he has ongoing pain all day long.  He has noted intermittent small volume of blood in his stool however none in the past day.  He has been taking multiple medications for this including 3 creams stool softeners, and Percocet.  He was last seen here and evaluated 04/11/2023 where he underwent a CT of the abdomen and pelvis which were otherwise normal.  There is no evidence of perirectal abscess or other acute findings.  He has an outpatient follow-up with gastroenterology and a scheduled colonoscopy in 3 days.  He states that his symptoms have been ongoing and are persisting and has been having difficulty sleeping which was his reason for presentation today.  Otherwise, his symptoms are unchanged from his recent baseline.  Denies any frank abdominal pains.  No fevers.  States that he has been having loose stools secondary to his stool softeners.  No urinary symptoms.  No lesions or discharge.  Otherwise ROS negative.      I have independently reviewed the patient's past medical records and accompanying documentation.    REVIEW OF SYSTEMS:  Please see HPI.  All other systems were reviewed and are negative.    PAST MEDICAL HISTORY    Past Medical History:   Diagnosis Date   . Bipolar affective disorder (HCC)    . Hypertension    . Kidney stone        SURGICAL HISTORY    Past Surgical History:   Procedure Laterality Date   . PROCEDURE N/A 03/24/2014    Procedure: CYSTOSCOPY; URETEROSCOPY; LASER  HOLMIUM LITHOTRIPSY; STENT PLACEMENT;  Surgeon: Marton Redwood, MD;  Location: [facility] OR;  Service: Urology;  Laterality: N/A;       HOME MEDICATION LIST    Home Medications     Med List Status: Initial Review In Progress Set By: Rafael Bihari, RN at 04/25/2023  2:07 AM        Last Dose     buPROPion ER (WELLBUTRIN XL) 150 mg 24 hr tablet  --     Take 150 mg by mouth once daily.     HYDROcodone-acetaminophen (NORCO) 10-325 mg per tablet  --     Take 1 tablet by mouth every 6 (six) hours as needed for Pain.     lamoTRIgine (LAMICTAL) 150 MG tablet (Expired)  --     Take 1 tablet (150 mg total) by mouth nightly for 30 days.     lithium (LITHOBID) 300 MG CR tablet (Expired)  --     Take 1 tablet (300 mg total) by mouth every 12 (twelve) hours for 30 days.     meloxicam (MOBIC) 15 MG tablet  --     No more than once daily by mouth for pain,     prazosin (MINIPRESS) 1 MG capsule (Expired)  --     Take  3 capsules (3 mg total) by mouth nightly for 30 days.     QUEtiapine (SEROQUEL XR) 400 MG 24 hr tablet  --     Take 400 mg by mouth nightly.     tamsulosin (FLOMAX) 0.4 mg Cap  --     Take 1 capsule by mouth every night at bedtime.     traZODone (DESYREL) 100 MG tablet  --     Take 200 mg by mouth every night at bedtime.            ALLERGIES    Penicillins    FAMILY HISTORY    History reviewed. No pertinent family history.    SOCIAL HISTORY    Social History     Tobacco Use   . Smoking status: Never   . Smokeless tobacco: Never   Substance Use Topics   . Alcohol use: Yes     Alcohol/week: 1.0 - 4.0 standard drink of alcohol     Types: 1 - 4 Cans of beer per week     Comment: 2-3 X/mo   . Drug use: Yes     Frequency: 1.0 times per week     Types: Marijuana         PHYSICAL EXAM:   INITIAL VITAL SIGNS:    Initial Vitals [04/24/23 2209]   BP (!) 152/92   Pulse 84   Resp 18   Temp 36.9 ?C (98.4 ?F)   SpO2 98 %       General:  Alert, conversant and pleasant.  Appears in mild discomfort, otherwise in no acute distress and  speaking full sentences.  HEENT:  Normocephalic, Atraumatic.    Neck: Supple.   Chest:  Normal respiratory effort.    Cardiovascular:  Regular rate and rhythm.   GI:  Soft, non-tender to superficial or deep palpation.  External exam demonstrates a flattened hemorrhoid at the 6 o'clock position without evidence of thrombosis or engorgement.  Patient declines digital rectal examination.  Extremities: Warm, well perfused.   Skin:  No rashes or bruising noted.   Neurologic:  Alert & oriented x 3, nonfocal exam.   Psychiatric: Mood and affect normal.    Patient/patient representative was informed of the right to and was offered a licensed medical chaperone prior to any sensitive exam such as genital, rectal, or breast exam:  Patient/patient rep DECLINED a chaperone for sensitive exams now and for extent of hospitalization and is aware that at any time can request a chaperone      RESULTS :     LABS    Results for orders placed or performed during the hospital encounter of 04/25/23 (from the past 24 hour(s))   CBC with Auto Differential -STAT    Collection Time: 04/25/23  2:22 AM   Result Value Ref Range    WBC 9.1 4.8 - 10.8 10*3/?L    RBC 4.95 4.70 - 6.10 10*6/?L    Hemoglobin 14.4 12.0 - 18.0 g/dL    HCT 16.1 (L) 09.6 - 54.0 %    MCV 83.9 81.0 - 99.0 fL    MCH 29.1 27.0 - 34.0 pg    MCHC 34.7 32.0 - 36.0 g/dL    RDW 04.5 40.9 - 81.1 %    Platelet Count 219 150 - 400 10*3/?L    MPV 7.0 (L) 7.4 - 10.4 fL    Neutrophils % 53 35 - 70 %    Lymphocytes % 37 25 - 45 %    Monocytes %  9 0 - 12 %    Eosinophils % 0 0 - 7 %    Basophils % 0 0 - 2 %    Neutrophils, Absolute 4.8 1.6 - 7.3 10*3/?L    Lymphocytes, Absolute 3.4 1.1 - 4.3 10*3/?L    Monocytes, Absolute 0.9 0.0 - 1.2 10*3/?L    Eosinophils, Absolute 0.0 0.0 - 0.7 10*3/?L    Basophils, Absolute 0.0 0.0 - 0.2 10*3/?L    Differential Type Automated Differential    Comprehensive Metabolic Panel -STAT    Collection Time: 04/25/23  2:22 AM   Result Value Ref Range    Sodium  136 135 - 143 mmol/L    Potassium 3.9 3.5 - 5.1 mmol/L    Chloride 102 98 - 111 mmol/L    CO2 - Carbon Dioxide 25 21 - 31 mmol/L    Glucose 81 80 - 99 mg/dL    BUN 19 6 - 23 mg/dL    Creatinine 9.14 7.82 - 1.30 mg/dL    Calcium 9.3 8.6 - 95.6 mg/dL    AST - Aspartate Aminotransferase 13 10 - 50 IU/L    ALT - Alanine Aminotransferase 14 7 - 52 IU/L    Alkaline Phosphatase 59 34 - 104 IU/L    Bilirubin Total 0.5 0.3 - 1.2 mg/dL    Protein Total 7.3 6.0 - 8.0 g/dL    Albumin 4.1 3.5 - 5.0 g/dL    Globulin 3.2 2.0 - 3.7 g/dL    Albumin/Globulin Ratio 1.3 >0.9    Anion Gap 9 4 - 13 mmol/L    Glomerular Filtration Rate Estimate (Male) >60 >=60 mL/min/1.40m*2    GFR Additional Info     Erythrocyte Sedimentation Rate, Automated -STAT    Collection Time: 04/25/23  2:22 AM   Result Value Ref Range    Erythrocyte Sedimentation Rate, Automated 4 0 - 20 mm/hr   C-reactive protein -STAT    Collection Time: 04/25/23  2:22 AM   Result Value Ref Range    CRP 7.33 (H) <5.00 mg/L       RADIOLOGY/PROCEDURES    No orders to display     ED COURSE & MEDICAL DECISION MAKING    Data results reviewed. Nursing note reviewed.     ED Course as of 04/25/23 0548   Sat Apr 25, 2023   0239 Hgb: 14.4   0301 Sed Rate: 4       This is a 52 year old male who presents today now with subacute persistent rectal pain which is worse with defecation and is occasionally associated with small volume of bright red blood per rectum.  He had a recent workup which included a CT of the abdomen as well as a subsequent CT of the pelvis which was negative for any acute process such as perirectal abscess, mass, or large erosive lesions.  He has a benign abdomen here in the emergency department and his presentation today is due to his ongoing symptoms without any frank exacerbation.  He is currently on effectively what is a maximal medical management and he has a scheduled colonoscopy in 3 days.  I did consider other etiologies including but not limited to inflammatory  bowel disease and for this reason labs including inflammatory markers were obtained which were otherwise reassuring.  I did counsel him regarding glycerin suppositories to help with passage of bowel movements however ultimately feel that he would most benefit from continuation with his outpatient workup.  He was discharged in stable condition with return precautions  discussed.    Medications Administered       Date/Time Order Dose Route Action     04/25/2023 0243 PDT morphine injection 6 mg 6 mg Intramuscular Given          DISPOSITION:  As above.    CLINICAL IMPRESSION:    SNOMED CT(R)   1. Rectal pain  RECTAL PAIN                    **This document was created with the assistance of voice-to-text technology. Effort has been made to minimize transcription errors. Please allow for homonyms and other similar transcription errors, such as she in place of he and vice versa.Marcine Matar, MD  04/25/23 [phone]

## 2023-04-25 NOTE — Discharge Instructions (Addendum)
Thankfully your blood work today was otherwise very reassuring.  Your inflammatory markers were not significantly elevated and this is less consistent with an inflammatory bowel disease.  Unfortunately, you are on a multitude of medications which effectively reaches the maximal therapy at this time.  The next step in your workup is to continue to follow-up with your gastroenterologist for your colonoscopy.  I would encourage you to begin using glycerin suppositories prior to bowel movements as this will help to relax the anal sphincter.  If you develop significant rectal bleeding, fevers, severe abdominal pains or have any additional concerns then return to emergency department sooner.

## 2023-04-28 NOTE — H&P (Signed)
[facility] - Endoscopy Consultation         Assessment/Plan:   History of adenomatous colonic polyps, history of hemorrhoids, rectal bleeding: We discussed the colonoscopy procedure and the risks, benefits and alternatives including but not limited to infection, bleeding, colon perforation and inability to complete exam. Instructions were reviewed with the patient including diet modifications and Golytely bowel preparation instructions. The patient voiced understanding and wishes to proceed. Patient is scheduled on 03/30/2023 at 1:30 PM with Dr. Lisbeth Ply.         HPI:  Richard Harrison is a 52 y.o. male with past medical history pertinent for hypertension, bipolar 1, PTSD, colonic polyps, external and internal hemorrhoids, and BMI of 33.      Patient was referred by his PCP for rectal bleeding. Bleeding is light on toilet tissue. Patient has known external hemorrhoids. S/p hemorrhoid banding > 10 years ago.      Patient has had a previous colonoscopy. Last colonoscopy was performed on 08/19/2013 by Dr. Sande Brothers. Findings included one 8 mm polyp in the ascending colon and internal hemorrhoids. Pathology revealed tubular adenoma. Patient was instructed to repeat colonoscopy in 5-10 years.         Patient does not have family history of colon cancer or inflammatory bowel disease.      Patient denies problems with constipation or diarrhea, melena, abdominal pain, and unintentional weight loss.      Patient denies episodes of chest pain or shortness of breath. Patient has had previous issues with anesthesia. He reports he once woke up during a colonoscopy. No other previous issues with anesthesia.        History:       Patient Active Problem List   Diagnosis   . Chondral defect of left patella   . Osteoarthritis of left knee   . Encounter for colonoscopy due to history of adenomatous colonic polyps   . Rectal bleeding   . History of hemorrhoids           Past Medical History:   Diagnosis Date   . Bipolar 1 disorder  (HCC)     . Bipolar affective (HCC)     . Hypertension     . Joint pain     . PTSD (post-traumatic stress disorder)        No past surgical history on file.  No family history on file.  Social History           Socioeconomic History   . Marital status: Married   Tobacco Use   . Smoking status: Never   Substance and Sexual Activity   . Alcohol use: No   Social History Narrative     ** Merged History Encounter **                   Current Outpatient Medications   Medication Sig Dispense Refill   . lamoTRIgine (LAMICTAL) 200 MG tablet         . MAGNESIUM CITRATE by Does not apply route.       . polyethylene glycol (GOLYTELY) 236 g solution Take 2,000 mLs by mouth Daily for 2 doses . Follow detailed instructions mailed to you. 4000 mL 0   . QUEtiapine (SEROQUEL XR) 400 mg ER tablet Take 1 tablet by mouth.       . tamsulosin (FLOMAX) 0.4 mg CAPS         . traZODone (DESYREL) 100 mg tablet Take 1 tablet by mouth.  No current facility-administered medications for this visit.

## 2023-04-28 NOTE — Progress Notes (Signed)
The patient came in for a colonoscopy today.  His symptoms include constant pain at the anal area with bleeding upon wiping.  It was discussed with the patient before the colonoscopy was performed that I will perform an examination of the area.  Despite seeing 3 providers for this issue no one has ever performed a rectal exam on him.  Therefore I performed a visual inspection of the area after the patient was given propofol.  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  There was some chronic component to it with mild active bleeding.  Given this and the fact that the patient still has active pain and has not been treated for this I did not perform a colonoscopy on him.  Pictures were taken of the area.  The patient will be given treatment for anal fissure and counseling.  We will see him in clinic in 1 month from now.

## 2023-05-11 DIAGNOSIS — K602 Anal fissure, unspecified: Secondary | ICD-10-CM

## 2023-05-11 NOTE — Discharge Instructions (Addendum)
Continue to add fiber to your diet, Metamucil or psyllium husk fiber is a good supplement that we will add volume to your stools and make them softer and easier to pass.    Part of the problem is the opioid medications that have been prescribed will slow down bowel motility and cause constipation-one of the main things the colon does is remove water from stool and so your stools will get larger, harder and more difficult to pass which will cause excruciating pain with bowel movements.    I have prescribed you nitroglycerin paste to be used twice a day, this medication will help reduce spasm and increase blood flow to the rectal area to improve healing.    I have also prescribed you lidocaine to be placed around the anus and into the rectum for pain relief.    I also recommend using the lidocaine before you eat, as we discussed, the gastrocolic reflex will typically make you have a bowel movement in 30 to 60 minutes after you eat.  So if you are planning on having a meal, add the local anesthetic to the rectum and then have your meal so that you get some numbing effect before you have a bowel movement.    Continue the sitz bath's as frequently as you can throughout the day.    I recommend using Tylenol/acetaminophen 1000 mg every 8 hours in addition to ibuprofen/Advil/Motrin 800 mg every 8 hours.  This means you can alternate them and maintain a constant level of pain reliever in your bloodstream without the [employer] of recurrent pain.    To do this, you will alternate between each medication so that every 4 hours you are taking the alternate medication. For example: if you take acetaminophen at noon, then at 4:00pm you can take ibuprofen, then at 8:00pm, acetaminophen again.     We gave you medication in the emergency department to reverse the effects of opioids so that your bowels start moving and you do not have a large hard stools.  If you run into uncontrolled pain and are supposed to take a Tylenol dose  over the next 12 to 24 hours you could potentially take 1 Percocet dose and it should not affect you because of this reversal medication we gave you.    For severe bleeding, dizziness or passing out, chest pain, shortness of breath, or any other new, concerning or worsening symptoms including uncontrolled pain return to the ER.    I recommend using your CPAP at night to prevent low oxygen saturations.    Follow-up with Dr.

## 2023-05-11 NOTE — ED Triage Notes (Signed)
Came ikn with c/o rectal bleeding when going to the bathroom per pt it's been long time. Take percocet in AM.

## 2023-05-11 NOTE — ED Notes (Signed)
Patient states that he has an anal fissure that became exceptionally painful on Saturday after a large BM. States that the pain is unbearable, that his proctologist's nurse referred him to his PCP, that his PCP is out of town through [place], and that he has been to [facility] and [facility] who both referred him to his PCP. Pt is seeking pain control, stating that Tylenol, Ibuprofen, and Percocet are not controlling the pain and that he is unable to sleep or do his ADLs.

## 2023-05-11 NOTE — ED Provider Notes (Signed)
History       Alson Tiegs is a 52 y.o. year old male presenting with Rectal Bleeding  .    HPI 52 year old male history of bipolar affective disorder, hypertension, kidney stones, hemorrhoids and prior anal fissure presents to the emergency department with continued pain from suspected anal fissure.  Patient first presented to [facility] 04/11/2019.  Since then, saw PCP on 4/30, return to the ER on 5/4, saw the surgeon who is going to do his colonoscopy on 5/7 and on seeing a fissure the surgeon deferred the procedure.  Patient has continued to have pain in about the 12 o'clock position of the anus, pain is severe, constant, ongoing and throbbing, denies any fevers or chills nausea vomiting, she had looser stools.  He has been provided diltiazem cream, he is taking Percocet, says he took ibuprofen and acetaminophen in the morning today, nothing in the afternoon or evening.  He is taking fiber supplements, and a stool softener and the stools have been loose but did pass a large hard bowel movement on Saturday which acutely worsened his pain, did have a significant mount of blood when he had a bowel movement last Saturday as well, he has had blood with wiping since then.  Denies chest pain, shortness of breath, dizziness or lightheadedness, no generalized weakness.  No fevers or chills.      Past Medical History:   Diagnosis Date    Bipolar affective disorder (HCC)     Hypertension     Kidney stone        Past Surgical History:   Procedure Laterality Date    PROCEDURE N/A 03/24/2014    Procedure: CYSTOSCOPY; URETEROSCOPY; LASER HOLMIUM LITHOTRIPSY; STENT PLACEMENT;  Surgeon: Marton Redwood, MD;  Location: [facility] OR;  Service: Urology;  Laterality: N/A;       No family history on file.    Social History     Tobacco Use    Smoking status: Never    Smokeless tobacco: Never   Substance Use Topics    Alcohol use: Yes     Alcohol/week: 1.0 - 4.0 standard drink of alcohol     Types: 1 - 4 Cans of beer  per week     Comment: 2-3 X/mo    Drug use: Yes     Frequency: 1.0 times per week     Types: Marijuana     Other Social History Comments:       Home Medications       Med List Status: Initial Review Done Set By: Erven Colla, RN at 05/11/2023  7:42 PM          Last Dose     buPROPion ER (WELLBUTRIN XL) 150 mg 24 hr tablet  --     Take 150 mg by mouth once daily.     HYDROcodone-acetaminophen (NORCO) 10-325 mg per tablet  --     Take 1 tablet by mouth every 6 (six) hours as needed for Pain.     lamoTRIgine (LAMICTAL) 150 MG tablet (Expired)  --     Take 1 tablet (150 mg total) by mouth nightly for 30 days.     lithium (LITHOBID) 300 MG CR tablet (Expired)  --     Take 1 tablet (300 mg total) by mouth every 12 (twelve) hours for 30 days.     meloxicam (MOBIC) 15 MG tablet  --     No more than once daily by mouth for pain,  prazosin (MINIPRESS) 1 MG capsule (Expired)  --     Take 3 capsules (3 mg total) by mouth nightly for 30 days.     QUEtiapine (SEROQUEL XR) 400 MG 24 hr tablet  --     Take 400 mg by mouth nightly.     tamsulosin (FLOMAX) 0.4 mg Cap  --     Take 1 capsule by mouth every night at bedtime.     traZODone (DESYREL) 100 MG tablet  --     Take 200 mg by mouth every night at bedtime.            Review of Systems  ROS: At least 10pt or greater review of systems completed and are negative except where specified in the HPI.    Physical Exam   BP 99/66   Pulse 74   Temp 36.5 ?C (97.7 ?F) (Temporal)   Resp 15   Wt 90.7 kg (200 lb)   SpO2 96%   BMI 29.53 kg/m?     Physical Exam  VITAL SIGNS:    Vitals:    05/11/23 2130 05/11/23 2230 05/11/23 2245 05/11/23 2300   BP: 103/66 109/81  99/66   Patient Position:       Pulse: 74 71  74   Resp: 19 14 15     Temp:       TempSrc:       SpO2: 96% 95%  96%   Weight:         Nursing notes reviewed.    CONSTITUTIONAL: Awake, orientedx4, appears non-toxic, well nourished and well developed  HENT: Atraumatic, normocephalic, oropharynx pink and moist w/o erythema or  exudate, airway patent. Nares patent without drainage, turbinates appear pink, normal. External ears normal.  EYES: Conjunctiva clear, EOMI, PERRLA  NECK: Trachea midline, non-tender, supple  CARDIOVASCULAR: Normal heart rate, Regular rhythm, No extra heart sounds heard. Radial Pulses 2+ B/L, DP/PT pulses 2+ B/L. No JVD.  PULMONARY/CHEST: Clear breath sounds, no rhonchi, wheezes, or rales heard.  Symmetrical breath sounds. Non-tender chest wall.  ABDOMINAL: Non-distended, soft, mild tenderness in the lower abdomen without rebound or guarding, no other tenderness.  RECTAL: Normal sphincter tone, no gross blood, tan external hemorrhoid at the 12:00, I applied half an inch of Nitropaste and about an inch of Emla cream to area of the hemorrhoid which is not erythematous or swollen, seems to be a thrombosed hemorrhoid is I passed the cream further into the anal canal.  No fluctuance.  Patient does not tolerate any kind of pressure  NEUROLOGIC: Non-focal, moving all four extremities, no gross sensory or motor deficits.  EXTREMITIES: No clubbing, cyanosis, or edema. Digits well perfused with <2 sec cap refill in distal UE/LE.  SKIN: Warm, Dry, No erythema, No rash    ED Course   Procedures    Assessment & Plan   This is a 52 year old male with history of hemorrhoids who is now on his fifth contact with healthcare providers concerning rectal pain, and ongoing intermittent rectal bleeding.  He does not appear to be anemic, he is not significantly tachycardic, heart rate is 100, hypertensive at 141/100 which I suspect is secondary to pain.    Will check blood counts, type and screen, CMP to assess hydration status.  Appears to have an older thrombosed hemorrhoid at the 12 o'clock position as well as a fissure as he is exquisitely tender.  CT scan was done on initial contact on 4/30, he is afebrile at this time and nontoxic, it  is difficult to get a good rectal examination secondary to the patient's pain.    IV access is  established to provide the patient 1 g IV acetaminophen as well as 1 mg of IV hydromorphone.  He will get a small amount of fluid 500 mL while we are awaiting his labs, does not appear to be overtly dehydrated but I suspect has not been eating and drinking as well secondary to the pain.  He is already been using a compounded diltiazem cream given that would be the go to treatment will see how he responds to therapy tonight and consider surgical consult for possible admission for pain control.    Patient re-evaluated and more comfortable. Labs unremarkable, stable H&H. Normal CMP  MDM  D/W Dr. Renda Rolls, pt to DC opioids, keep sitz baths, fiber  I will switch to nitroglycerin from Dilt compound, pt reminded this drug can cause hypotension, DC dilt  Topical lidocaine Q4, can use prior to BM or and advised to use before eating 2/2 Gastrocolic reflex  Ibuprofen and APAP, no longer takes meloxicam  Methylnaltrexone dose given in ED given opioid Rx recently           Abnormal Labs Reviewed   CBC WITH AUTO DIFFERENTIAL - Abnormal; Notable for the following components:       Result Value    Lymphocytes % 21 (*)     Neutrophils, Absolute 7.4 (*)     All other components within normal limits   COMPREHENSIVE METABOLIC PANEL - Abnormal; Notable for the following components:    Sodium 134 (*)     Glucose 106 (*)     All other components within normal limits    Narrative:     A fasting result in the range of 100 to 125 mg/dL suggests impaired fasting glucose.         Results for orders placed or performed during the hospital encounter of 05/11/23 (from the past 24 hour(s))   CBC with Auto Differential -STAT    Collection Time: 05/11/23  8:37 PM   Result Value Ref Range    WBC 10.6 4.8 - 10.8 10*3/?L    RBC 4.93 4.70 - 6.10 10*6/?L    Hemoglobin 14.7 12.0 - 18.0 g/dL    HCT 16.1 09.6 - 04.5 %    MCV 85.7 81.0 - 99.0 fL    MCH 29.7 27.0 - 34.0 pg    MCHC 34.7 32.0 - 36.0 g/dL    RDW 40.9 81.1 - 91.4 %    Platelet Count 258 150 -  400 10*3/?L    MPV 7.8 7.4 - 10.4 fL    Neutrophils % 70 35 - 70 %    Lymphocytes % 21 (L) 25 - 45 %    Monocytes % 8 0 - 12 %    Eosinophils % 1 0 - 7 %    Basophils % 1 0 - 2 %    Neutrophils, Absolute 7.4 (H) 1.6 - 7.3 10*3/?L    Lymphocytes, Absolute 2.2 1.1 - 4.3 10*3/?L    Monocytes, Absolute 0.8 0.0 - 1.2 10*3/?L    Eosinophils, Absolute 0.1 0.0 - 0.7 10*3/?L    Basophils, Absolute 0.1 0.0 - 0.2 10*3/?L    Differential Type Automated Differential    Comprehensive Metabolic Panel -STAT    Collection Time: 05/11/23  8:37 PM   Result Value Ref Range    Sodium 134 (L) 135 - 143 mmol/L    Potassium 3.9 3.5 - 5.1  mmol/L    Chloride 102 98 - 111 mmol/L    CO2 - Carbon Dioxide 24 21 - 31 mmol/L    Glucose 106 (H) 80 - 99 mg/dL    BUN 10 6 - 23 mg/dL    Creatinine 1.61 0.96 - 1.30 mg/dL    Calcium 9.5 8.6 - 04.5 mg/dL    AST - Aspartate Aminotransferase 16 10 - 50 IU/L    ALT - Alanine Aminotransferase 16 7 - 52 IU/L    Alkaline Phosphatase 63 34 - 104 IU/L    Bilirubin Total 0.4 0.3 - 1.2 mg/dL    Protein Total 7.5 6.0 - 8.0 g/dL    Albumin 4.6 3.5 - 5.0 g/dL    Globulin 2.9 2.0 - 3.7 g/dL    Albumin/Globulin Ratio 1.6 >0.9    Anion Gap 8 4 - 13 mmol/L    Glomerular Filtration Rate Estimate (Male) >60 >=60 mL/min/1.56m*2    GFR Additional Info     ABO/Rh (HCLL) -Once    Collection Time: 05/11/23  8:37 PM   Result Value Ref Range    ABO O     Rh (D) Negative    Antibody Screen (HCLL) -Once    Collection Time: 05/11/23  8:37 PM   Result Value Ref Range    Antibody Screen Negative        No orders to display       Medications   lidocaine-prilocaine (EMLA) 2.5 %-2.5 % cream ( Topical Given 05/11/23 2009)   nitroglycerin (NITROBID) 2 % ointment 0.5 g (0.5 g Topical Given 05/11/23 2009)   NaCl 0.9 % (NS flush) syringe 10 mL (10 mL Intravenous Given 05/11/23 2059)   HYDROmorphone (DILAUDID) syringe 1 mg (1 mg Intravenous Given 05/11/23 2229)   acetaminophen (OFIRMEV) IV 1,000 mg (0 mg Intravenous Stopped 05/11/23 2058)    lactated Ringer's bolus 500 mL (0 mL Intravenous Stopped 05/11/23 2145)   methylnaltrexone (RELISTOR) injection 12 mg (12 mg Subcutaneous Given 05/11/23 2310)       ED Disposition: Discharge    Diagnoses that have been ruled out:   None   Diagnoses that are still under consideration:   None   Final diagnoses:   Anal fissure       DISCHARGE MEDICATIONS:  Discharge Medication List as of 05/11/2023 11:02 PM        START taking these medications    Details   lidocaine (XYLOCAINE) 2 % jelly Apply topically every 4 hours as needed. Apply around anus and into rectum with gloved hand every 4 hours as needed for pain, Starting Mon 05/11/2023, Normal      nitroglycerin 0.4 % (w/w) Oint Place 0.5 inches rectally 2 times daily., Starting Mon 05/11/2023, Normal             DECISION TO ADMIT:   5/20 2216               Vanessa Ralphs, MD  05/12/23 4098

## 2023-05-12 ENCOUNTER — Inpatient Hospital Stay
Admit: 2023-05-12 | Discharge: 2023-05-12 | Disposition: A | Discharge status: Home / Self Care | Payer: MEDICARE | Attending: MD

## 2023-05-12 LAB — COMPREHENSIVE METABOLIC PANEL
ALT - Alanine Aminotransferase: 16 IU/L (ref 7–52)
AST - Aspartate Aminotransferase: 16 IU/L (ref 10–50)
Albumin/Globulin Ratio: 1.6 (ref >0.9)
Albumin: 4.6 g/dL (ref 3.5–5.0)
Alkaline Phosphatase: 63 IU/L (ref 34–104)
Anion Gap: 8 mmol/L (ref 4–13)
BUN: 10 mg/dL (ref 6–23)
Bilirubin Total: 0.4 mg/dL (ref 0.3–1.2)
CO2 - Carbon Dioxide: 24 mmol/L (ref 21–31)
Calcium: 9.5 mg/dL (ref 8.6–10.3)
Chloride: 102 mmol/L (ref 98–111)
Creatinine: 0.85 mg/dL (ref 0.65–1.30)
Globulin: 2.9 g/dL (ref 2.0–3.7)
Glomerular Filtration Rate Estimate (Male): >60 mL/min/{1.73_m2} (ref >=60)
Glucose: 106 mg/dL — ABNORMAL HIGH (ref 80–99)
Potassium: 3.9 mmol/L (ref 3.5–5.1)
Protein Total: 7.5 g/dL (ref 6.0–8.0)
Sodium: 134 mmol/L — ABNORMAL LOW (ref 135–143)

## 2023-05-12 LAB — CBC WITH AUTO DIFFERENTIAL
Basophils %: 1 % (ref 0–2)
Basophils, Absolute: 0.1 10*3/ÂµL (ref 0.0–0.2)
Eosinophils %: 1 % (ref 0–7)
Eosinophils, Absolute: 0.1 10*3/ÂµL (ref 0.0–0.7)
HCT: 42.2 % (ref 42.0–54.0)
Hemoglobin: 14.7 g/dL (ref 12.0–18.0)
Lymphocytes %: 21 % — ABNORMAL LOW (ref 25–45)
Lymphocytes, Absolute: 2.2 10*3/ÂµL (ref 1.1–4.3)
MCH: 29.7 pg (ref 27.0–34.0)
MCHC: 34.7 g/dL (ref 32.0–36.0)
MCV: 85.7 fL (ref 81.0–99.0)
MPV: 7.8 fL (ref 7.4–10.4)
Monocytes %: 8 % (ref 0–12)
Monocytes, Absolute: 0.8 10*3/ÂµL (ref 0.0–1.2)
Neutrophils %: 70 % (ref 35–70)
Neutrophils, Absolute: 7.4 10*3/ÂµL — ABNORMAL HIGH (ref 1.6–7.3)
Platelet Count: 258 10*3/ÂµL (ref 150–400)
RBC: 4.93 10*6/ÂµL (ref 4.70–6.10)
RDW: 13.5 % (ref 11.5–14.5)
WBC: 10.6 10*3/ÂµL (ref 4.8–10.8)

## 2023-05-12 LAB — ABO/RH (HCLL): Rh (D): NEGATIVE

## 2023-05-12 LAB — ED INFORMATION EXCHANGE

## 2023-05-12 LAB — ANTIBODY SCREEN (HCLL): Antibody Screen: NEGATIVE

## 2023-05-12 MED ORDER — lidocaine-prilocaine (EMLA) 2.5 %-2.5 % cream
2.5 | Freq: Once | TOPICAL | Status: AC
Start: 2023-05-12 — End: 2023-05-11
  Administered 2023-05-12: 03:00:00 2.5 %- % via TOPICAL

## 2023-05-12 MED ORDER — lidocaine (XYLOCAINE) 2 % jelly
2 | 0 refills | 10.00000 days | Status: DC | PRN
Start: 2023-05-12 — End: 2023-06-22

## 2023-05-12 MED ORDER — HYDROmorphone (DILAUDID) syringe 1 mg
1 | INTRAMUSCULAR | Status: AC | PRN
Start: 2023-05-12 — End: 2023-05-11
  Administered 2023-05-12 (×2): 1 mg via INTRAVENOUS

## 2023-05-12 MED ORDER — nitroglycerin 0.4 % (w/w) Oint
0.4 | Freq: Two times a day (BID) | RECTAL | 0 refills | 10.00000 days | Status: DC
Start: 2023-05-12 — End: 2023-06-22

## 2023-05-12 MED ORDER — acetaminophen (OFIRMEV) IV 1,000 mg
1000 | Freq: Once | INTRAVENOUS | Status: AC
Start: 2023-05-12 — End: 2023-05-11
  Administered 2023-05-12: 04:00:00 1000 mg via INTRAVENOUS
  Administered 2023-05-12: 04:00:00 via INTRAVENOUS

## 2023-05-12 MED ORDER — NaCl 0.9 % (NS flush) syringe 10 mL
Freq: Once | INTRAMUSCULAR | Status: AC
Start: 2023-05-12 — End: 2023-05-11
  Administered 2023-05-12: 04:00:00 via INTRAVENOUS

## 2023-05-12 MED ORDER — nitroglycerin (NITROBID) 2 % ointment 0.5 g
2 | Freq: Once | TRANSDERMAL | Status: AC
Start: 2023-05-12 — End: 2023-05-11
  Administered 2023-05-12: 03:00:00 2 [in_us] via TOPICAL

## 2023-05-12 MED ORDER — lactated Ringer's bolus 500 mL
Freq: Once | INTRAVENOUS | Status: AC
Start: 2023-05-12 — End: 2023-05-11
  Administered 2023-05-12 (×2): via INTRAVENOUS

## 2023-05-12 MED ORDER — methylnaltrexone (RELISTOR) injection 12 mg
12 | Freq: Once | SUBCUTANEOUS | Status: AC
Start: 2023-05-12 — End: 2023-05-11
  Administered 2023-05-12: 06:00:00 12 mg via SUBCUTANEOUS

## 2023-05-12 MED FILL — LIDOCAINE-PRILOCAINE 2.5 %-2.5 % TOPICAL CREAM: 2.5 %-2.5 % | TOPICAL | Qty: 5

## 2023-05-12 MED FILL — ACETAMINOPHEN 1,000 MG/100 ML (10 MG/ML) INTRAVENOUS SOLUTION: 1000 mg/100 mL (10 mg/mL) | INTRAVENOUS | Qty: 100

## 2023-05-12 MED FILL — HYDROMORPHONE (PF) 1 MG/ML INJECTION SYRINGE: 1 mg/mL | INTRAMUSCULAR | Qty: 1

## 2023-05-12 MED FILL — RELISTOR 12 MG/0.6 ML SUBCUTANEOUS SYRINGE: 12 mg/0.6 mL | SUBCUTANEOUS | Qty: 0.6

## 2023-05-12 MED FILL — NITRO-BID 2 % TRANSDERMAL OINTMENT: 2 % | TRANSDERMAL | Qty: 1

## 2023-05-12 MED FILL — LACTATED RINGERS INTRAVENOUS SOLUTION: 500.0000 mL | INTRAVENOUS | Qty: 1000

## 2023-05-13 DIAGNOSIS — K6289 Other specified diseases of anus and rectum: Secondary | ICD-10-CM

## 2023-05-13 NOTE — ED Triage Notes (Signed)
Ongoing pain from anal fissure.

## 2023-05-13 NOTE — ED Provider Notes (Signed)
[facility] Department Encounter Note   Richard Harrison           ZOX:[id number]           WUJ:WJXBJY R ADAMS, MD    History     CC:   anal fissure      HPI:   Richard Harrison is a 52 y.o. male who presents to the ED for evaluation of rectal pain.  History is in the patient reports ongoing rectal pain over the last month and a half or so.  He notes that he is currently using 5 different creams and was scheduled for colonoscopy with Dr. Lisbeth Harrison but while he was sedated and prior to the colonoscopy, an anal fissure was found and so the colonoscopy was not performed.  He comes in tonight stating that the pain is intolerable and that he cannot wait any longer.  He is not having blood in his stool.  He is using all the medications that have been prescribed.  There is no associate abdominal pain, nausea or vomiting.    PMH:   He  has a past medical history of Bipolar affective disorder (HCC), Hypertension, and Kidney stone.    PSH:   He  has a past surgical history that includes Procedure (N/A, 03/24/2014).     Medications:   Discharge Medication List as of 05/13/2023 10:12 PM        CONTINUE these medications which have NOT CHANGED    Details   buPROPion ER (WELLBUTRIN XL) 150 mg 24 hr tablet Take 150 mg by mouth once daily., Historical Med      HYDROcodone-acetaminophen (NORCO) 10-325 mg per tablet Take 1 tablet by mouth every 6 (six) hours as needed for Pain., Starting 09/26/2015, Until Discontinued, Print      lamoTRIgine (LAMICTAL) 150 MG tablet Take 1 tablet (150 mg total) by mouth nightly for 30 days., Starting Mon 10/08/2015, Until Sat 04/11/2023, Print      lidocaine (XYLOCAINE) 2 % jelly Apply topically every 4 hours as needed. Apply around anus and into rectum with gloved hand every 4 hours as needed for pain, Starting Mon 05/11/2023, Normal      lithium (LITHOBID) 300 MG CR tablet Take 1 tablet (300 mg total) by mouth every 12 (twelve) hours for 30 days., Starting 10/08/2015, Until Wed 11/07/15, Print       meloxicam (MOBIC) 15 MG tablet No more than once daily by mouth for pain,, Print      nitroglycerin 0.4 % (w/w) Oint Place 0.5 inches rectally 2 times daily., Starting Mon 05/11/2023, Normal      prazosin (MINIPRESS) 1 MG capsule Take 3 capsules (3 mg total) by mouth nightly for 30 days., Starting Mon 10/08/2015, Until Sat 04/11/2023, Print      QUEtiapine (SEROQUEL XR) 400 MG 24 hr tablet Take 400 mg by mouth nightly., Until Discontinued, Historical Med      tamsulosin (FLOMAX) 0.4 mg Cap Take 1 capsule by mouth every night at bedtime., Starting Tue 03/25/2022, Normal      traZODone (DESYREL) 100 MG tablet Take 200 mg by mouth every night at bedtime., Historical Med             Allergies:   He is allergic to penicillins.    Social History:    He  reports that he has never smoked. He has never used smokeless tobacco. He  reports current alcohol use of about 1.0 - 4.0 standard drink of alcohol per week. He  reports current drug use. Frequency: 1.00 time per week. Drug: Marijuana.       Review of Systems:   As in HPI.    Physical Exam   VITAL SIGNS: Temp: 36.7 ?C (98.1 ?F) Pulse: (!) 112 Resp: 20 BP: (!) 172/109 SpO2: 97 %       Constitutional: No acute distress. Non-toxic appearance.    HEENT: Normocephalic. Atraumatic. Oropharynx clear.     Neck:  Supple.    Chest: No respiratory distress.    Cardiovascular: Periphery warm and well perfused    Abdomen: Soft, nontender.  Rectal exam reveals a very small external hemorrhoid at the 12 o'clock position.  Externally, I do not see any obvious anal fissure.  He does not tolerate any pressure to actually perform digital rectal exam.  There is no purulent drainage from the rectum.  No fluctuance, swelling or surrounding erythema.    Urogenital:  Deferred    Skin: Warm and dry. No erythema. No rash.     Back: Deferred    Extremities: No cyanosis, clubbing or edema.     Neurologic: Alert & oriented, Normal motor function, no deficits noted.    ED Course and Medical Decision  Making:    Richard Harrison presented to the ED for evaluation.  Medical records and nursing notes reviewed.  Patient who has been treated with multiple modalities for anal fissure noted on rectal exam prior to colonoscopy.  He has persistent rectal pain.  I do not see a clear anal fissure today although he does not tolerate rectal exam for me to examine internally.  I do not see external evidence of condition such as perianal abscess.  No discharge to suggest proctitis.  He has already had a CT of the abdomen pelvis for this condition which was unremarkable.  I discussed with him that while I am sympathetic to his discomfort, the only other thing I can think to offer him is oral diltiazem.  He likely needs referral to a colorectal surgeon which is not a specialty that I have available to me on call.  He does already have an appointment with his surgeon in 1 week.  I do not think that repeat labs which were unremarkable 2 days ago or repeat imaging is likely to be revealing in his case.  He was treated with intramuscular pain medication and a prescription for oral diltiazem was written for him.  Otherwise felt to be stable for discharge.    Labs Reviewed - No data to display          Final Impression:  Persistent rectal pain      This dictation was produced using voice-recognition software. Despite concurrent proofreading, please note that transcription errors are common and may not reflect the true intent of the reporter.           Elpidio Eric, DO  05/13/23 2225

## 2023-05-14 ENCOUNTER — Inpatient Hospital Stay
Admit: 2023-05-14 | Discharge: 2023-05-14 | Disposition: A | Discharge status: Home / Self Care | Payer: MEDICARE | Attending: DO

## 2023-05-14 LAB — ED INFORMATION EXCHANGE

## 2023-05-14 MED ORDER — ketorolac (TORADOL) 15 mg/mL injection 15 mg
15 | Freq: Once | INTRAMUSCULAR | Status: AC
Start: 2023-05-14 — End: 2023-05-13
  Administered 2023-05-14: 05:00:00 15 mg via INTRAMUSCULAR

## 2023-05-14 MED ORDER — dilTIAZem ER (CARDIZEM CD) 120 mg 24 hr capsule
120 | ORAL_CAPSULE | Freq: Every day | ORAL | 0 refills | 90.00000 days | Status: AC
Start: 2023-05-14 — End: 2023-07-03

## 2023-05-14 MED ORDER — HYDROmorphone (DILAUDID) injection 2 mg
2 | Freq: Once | INTRAMUSCULAR | Status: AC
Start: 2023-05-14 — End: 2023-05-13
  Administered 2023-05-14: 05:00:00 2 mg via INTRAMUSCULAR

## 2023-05-14 MED FILL — HYDROMORPHONE 2 MG/ML INJECTION SOLUTION: 2 mg/mL | INTRAMUSCULAR | Qty: 1

## 2023-05-14 MED FILL — KETOROLAC 15 MG/ML INJECTION SOLUTION: 15 mg/mL | INTRAMUSCULAR | Qty: 15

## 2023-05-29 ENCOUNTER — Ambulatory Visit: Admit: 2023-05-29 | Discharge: 2023-06-02 | Payer: MEDICARE | Attending: DO

## 2023-05-29 DIAGNOSIS — K602 Anal fissure, unspecified: Secondary | ICD-10-CM

## 2023-05-29 NOTE — H&P (Signed)
CC: Anal fissure    HPI:    This is a pleasant 52 y.o. male who was referred by Primus, Tomasa Hose, MD for  a chronic anal fissure.  It has been present for about 6 months.  He is gone through multiple rounds of nitroglycerin ointment and recently underwent Botox injections.  He continues to have significant pain with bowel movements as well as rectal bleeding.  He has not had a colonoscopy previously but this is scheduled once his fissure has resolved.    PMH:    Past Medical History:   Diagnosis Date    Bipolar affective disorder (HCC)     Hypertension     Kidney stone      No family history on file.  Past Surgical History:   Procedure Laterality Date    PROCEDURE N/A 03/24/2014    Procedure: CYSTOSCOPY; URETEROSCOPY; LASER HOLMIUM LITHOTRIPSY; STENT PLACEMENT;  Surgeon: Marton Redwood, MD;  Location: [facility] OR;  Service: Urology;  Laterality: N/A;     Social History     Socioeconomic History    Marital status: Divorced     Spouse name: Not on file    Number of children: Not on file    Years of education: Not on file    Highest education level: Not on file   Occupational History    Not on file   Tobacco Use    Smoking status: Never    Smokeless tobacco: Never   Substance and Sexual Activity    Alcohol use: Yes     Alcohol/week: 1.0 - 4.0 standard drink of alcohol     Types: 1 - 4 Cans of beer per week     Comment: 2-3 X/mo    Drug use: Yes     Frequency: 1.0 times per week     Types: Marijuana    Sexual activity: Yes     Partners: Female   Other Topics Concern    Not on file   Social History Narrative    Not on file     Social Determinants of Health     Financial Resource Strain: Low Risk  (01/29/2022)    Financial Resource Strain     Difficulty of Paying Living Expenses: Not on file     Access to Reliable Phone: Not on file   Food Insecurity: Not on file   Transportation Needs: Not on file   Physical Activity: Not on file   Stress: Not on file   Social Connections: Not on file   Intimate Partner Violence: Unknown (01/29/2022)     Intimate Partner Violence     Fear of Current or Ex-Partner: Not on file     Emotionally Abused: Not on file     Physically Abused: Not on file     Sexually Abused: Not on file     Questions Apply to Other Individual: Not on file   Housing Stability: Not on file         Outpatient Encounter Medications as of 05/29/2023   Medication Sig Dispense Refill    buPROPion ER (WELLBUTRIN XL) 150 mg 24 hr tablet Take 150 mg by mouth once daily.      dilTIAZem ER (CARDIZEM CD) 120 mg 24 hr capsule Take 1 capsule by mouth once daily. 30 capsule 0    HYDROcodone-acetaminophen (NORCO) 10-325 mg per tablet Take 1 tablet by mouth every 6 (six) hours as needed for Pain. 10 tablet 0    lamoTRIgine (LAMICTAL) 150 MG tablet Take  1 tablet (150 mg total) by mouth nightly for 30 days. 30 tablet 0    lidocaine (XYLOCAINE) 2 % jelly Apply topically every 4 hours as needed. Apply around anus and into rectum with gloved hand every 4 hours as needed for pain 30 mL 0    lithium (LITHOBID) 300 MG CR tablet Take 1 tablet (300 mg total) by mouth every 12 (twelve) hours for 30 days. 60 tablet 0    meloxicam (MOBIC) 15 MG tablet No more than once daily by mouth for pain, 15 tablet 0    nitroglycerin 0.4 % (w/w) Oint Place 0.5 inches rectally 2 times daily. 30 g 0    prazosin (MINIPRESS) 1 MG capsule Take 3 capsules (3 mg total) by mouth nightly for 30 days. 90 capsule 0    QUEtiapine (SEROQUEL XR) 400 MG 24 hr tablet Take 400 mg by mouth nightly.      tamsulosin (FLOMAX) 0.4 mg Cap Take 1 capsule by mouth every night at bedtime. 90 capsule 3    traZODone (DESYREL) 100 MG tablet Take 200 mg by mouth every night at bedtime.       No facility-administered encounter medications on file as of 05/29/2023.     Allergies   Allergen Reactions    Penicillins Anaphylaxis     As a child, per patient, states near death experience.         Review of Systems      Physical Exam  Constitutional:       Appearance: He is well-developed.   HENT:      Head: Normocephalic  and atraumatic.   Eyes:      Conjunctiva/sclera: Conjunctivae normal.   Cardiovascular:      Rate and Rhythm: Normal rate.   Pulmonary:      Effort: Pulmonary effort is normal.   Abdominal:      General: Abdomen is flat.      Palpations: Abdomen is soft.   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      Cervical back: Normal range of motion and neck supple.   Skin:     General: Skin is warm and dry.   Neurological:      Mental Status: He is alert and oriented to person, place, and time.   Psychiatric:         Mood and Affect: Mood normal.         Vitals:    05/29/23 1612   Pulse: 82   SpO2: 97%       Assessment:  Problem List Items Addressed This Visit       Anal fissure - Primary       No follow-ups on file.    Plan:  ---The etiology of the problem has been discussed and explained.  I have described the various treatment options with my recommendations and a handout has been provided.  All of their questions have been answered and discussed.  ---I have recommended that the patient undergo the discussed surgical procedure.  ---A PARQ discussion detailing the risks, benefits, and alternative options for the procedure have been discussed in detail.  We discussed the risks of bleeding, infection, incontinence, recurrence, and failure to resolve the issue.  Post operative care and recovery were explained.  All of the questions have been answered  and discussed.  ASA Class II  ---This note was, in part, created with the aid of voice recognition software.  --- We have discussed the findings on his physical examination today.  He does have an anal fissure that appears acute on chronic.  It does appear to have healed multiple times in the past and subsequently developed a subcutaneous fistula component to it in the posterior midline.  I suspect this is why it has  been difficult to get this to fully heal with his other modalities.  I have recommended a lateral internal sphincterotomy and we have reviewed the risk, benefits and possible complications associated with that procedure.  I will also clean up the subcutaneous fistula at the same time.  All of his questions have been answered today.  I will see him back for the procedure.    ---He will be set up for a procedure:  ---Lateral internal sphincterotomy--[id number]

## 2023-06-15 DIAGNOSIS — D127 Benign neoplasm of rectosigmoid junction: Secondary | ICD-10-CM | POA: Diagnosis not present

## 2023-06-15 DIAGNOSIS — K5730 Diverticulosis of large intestine without perforation or abscess without bleeding: Secondary | ICD-10-CM | POA: Diagnosis not present

## 2023-06-15 DIAGNOSIS — Z86010 Personal history of colonic polyps: Secondary | ICD-10-CM | POA: Diagnosis not present

## 2023-06-15 DIAGNOSIS — Z09 Encounter for follow-up examination after completed treatment for conditions other than malignant neoplasm: Secondary | ICD-10-CM | POA: Diagnosis not present

## 2023-06-15 DIAGNOSIS — D125 Benign neoplasm of sigmoid colon: Secondary | ICD-10-CM | POA: Diagnosis not present

## 2023-06-15 DIAGNOSIS — D123 Benign neoplasm of transverse colon: Secondary | ICD-10-CM | POA: Diagnosis not present

## 2023-06-22 NOTE — Procedures (Signed)
H&P DOS.   Consent DOS.   Orders in place.    Driver: Dellia Nims, Mother      Patient confirmed two patient identifiers, and location/type of surgery.     Instructions for pre operative medications from Pre-Operative & Procedure Medication Instructions for Patients Undergoing Anesthesia Admitted Through PACU [facility]) #[id number]. Patient will discuss any questions regarding NSAID's and vitamin supplements with their providers office. Following policy medications patient was instructed to take with sips of water at least 2 hrs prior to arrival time; Diltiazem, bupropion.     Patient has agreed to understanding the following;     -The patient must have a responsible adult driver following discharge from the facility. It is recommended to have assistance by an adult friend or family member to be available and/or present as needed for the first 24 hrs following same day discharges.      -Shower and disinfection instructions.   Antibacterial soap, unless she has been instructed differently by their surgeon.     -Instructions for NPO after midnight, DO NOT EAT or DRINK anything after midnight, except sips of water are allowed only until 2 hrs prior to your arrival time. NO sips of water within 2 hrs of arrival time. Unless they have been instructed differently by their Surgeon.     -We will contact them the day before surgery, or Friday before Monday procedures ONLY IF they have an arrival time change.     -They will contact the Surgeon's office or the Preop Department for any further questions or concerns.     -They will bring any assistive devices with them to their procedure that they use or may need while here.    -Patient has been instructed to remove all jewelry prior to coming to our facility for surgery.    -The after hours cancellation information [phone] for [facility].

## 2023-07-03 MED ORDER — rocuronium (ZEMURON) injection
10 | INTRAVENOUS | Status: DC | PRN
Start: 2023-07-03 — End: 2023-07-03
  Administered 2023-07-03: 16:00:00 10 mg/mL via INTRAVENOUS

## 2023-07-03 MED ORDER — docusate sodium (COLACE) 250 mg capsule
250 | Freq: Every day | ORAL | 0.00 refills | 30.00000 days | Status: AC | PRN
Start: 2023-07-03 — End: ?

## 2023-07-03 MED ORDER — ibuprofen (ADVIL) 600 mg tablet
600 | ORAL_TABLET | ORAL | 0 refills | 10.00000 days | Status: AC | PRN
Start: 2023-07-03 — End: ?
  Filled 2023-07-03: qty 30, 5d supply, fill #0

## 2023-07-03 MED ORDER — HYDROmorphone (DILAUDID) syringe 0.2-0.4 mg
1 | INTRAMUSCULAR | Status: DC | PRN
Start: 2023-07-03 — End: 2023-07-03

## 2023-07-03 MED ORDER — prochlorperazine (COMPAZINE) injection 10 mg
10 | Freq: Four times a day (QID) | INTRAMUSCULAR | Status: DC | PRN
Start: 2023-07-03 — End: 2023-07-03

## 2023-07-03 MED ORDER — fentaNYL (PF) (SUBLIMAZE) injection
50 | INTRAMUSCULAR | Status: DC | PRN
Start: 2023-07-03 — End: 2023-07-03
  Administered 2023-07-03: 16:00:00 50 mcg/mL via INTRAVENOUS

## 2023-07-03 MED ORDER — fentaNYL (PF) (SUBLIMAZE) injection 25-50 mcg
50 | INTRAMUSCULAR | Status: DC | PRN
Start: 2023-07-03 — End: 2023-07-03

## 2023-07-03 MED ORDER — lidocaine (PF) (XYLOCAINE) 20 mg/mL (2 %) injection
20 | INTRAMUSCULAR | Status: DC | PRN
Start: 2023-07-03 — End: 2023-07-03
  Administered 2023-07-03: 16:00:00 20 mg/mL (2 %) via INTRAVENOUS

## 2023-07-03 MED ORDER — fentaNYL (PF) (SUBLIMAZE) 50 mcg/mL injection
50 | INTRAMUSCULAR | Status: AC
Start: 2023-07-03 — End: ?

## 2023-07-03 MED ORDER — lactated Ringer's infusion
INTRAVENOUS | Status: DC
Start: 2023-07-03 — End: 2023-07-03
  Administered 2023-07-03 (×3): via INTRAVENOUS

## 2023-07-03 MED ORDER — propofoL (DIPRIVAN) injection
10 | INTRAVENOUS | Status: DC | PRN
Start: 2023-07-03 — End: 2023-07-03
  Administered 2023-07-03: 16:00:00 10 mg/mL via INTRAVENOUS

## 2023-07-03 MED ORDER — fentaNYL (PF) (SUBLIMAZE) injection 50-100 mcg
50 | INTRAMUSCULAR | Status: DC | PRN
Start: 2023-07-03 — End: 2023-07-03

## 2023-07-03 MED ORDER — sodium chloride (NS) 0.9% irrigation
0.9 | Status: DC | PRN
Start: 2023-07-03 — End: 2023-07-03
  Administered 2023-07-03: 17:00:00 0.9 % irrigation

## 2023-07-03 MED ORDER — naloxone (NARCAN) injection 40 mcg
0.4 | INTRAMUSCULAR | Status: DC | PRN
Start: 2023-07-03 — End: 2023-07-03

## 2023-07-03 MED ORDER — sugammadex (BRIDION) injection
100 | INTRAVENOUS | Status: DC | PRN
Start: 2023-07-03 — End: 2023-07-03
  Administered 2023-07-03: 17:00:00 100 mg/mL via INTRAVENOUS

## 2023-07-03 MED ORDER — BUPivacaine-EPINEPHrine (PF) 0.25 %-1:200,000 injection
0.25 | INTRAMUSCULAR | Status: DC | PRN
Start: 2023-07-03 — End: 2023-07-03
  Administered 2023-07-03: 17:00:00 0.25 %-1:200,000

## 2023-07-03 MED ORDER — meperidine (PF) (DEMEROL) injection 12.5 mg
25 | INTRAMUSCULAR | Status: DC | PRN
Start: 2023-07-03 — End: 2023-07-03

## 2023-07-03 MED ORDER — diphenhydrAMINE (BENADRYL) injection 25 mg
50 | INTRAMUSCULAR | Status: DC | PRN
Start: 2023-07-03 — End: 2023-07-03

## 2023-07-03 MED ORDER — BUPivacaine-EPINEPHrine (PF) 0.25 %-1:200,000 injection
0.25 | INTRAMUSCULAR | Status: AC
Start: 2023-07-03 — End: ?

## 2023-07-03 MED ORDER — dexAMETHasone (DECADRON) injection
4 | INTRAMUSCULAR | Status: DC | PRN
Start: 2023-07-03 — End: 2023-07-03
  Administered 2023-07-03: 16:00:00 4 mg/mL via INTRAVENOUS

## 2023-07-03 MED ORDER — HYDROcodone-acetaminophen (NORCO) 5-325 mg per tablet
5-325 | ORAL_TABLET | ORAL | 0 refills | 30.00000 days | Status: AC | PRN
Start: 2023-07-03 — End: 2023-07-10

## 2023-07-03 MED ORDER — HYDROmorphone (DILAUDID) syringe 0.5-0.8 mg
1 | INTRAMUSCULAR | Status: DC | PRN
Start: 2023-07-03 — End: 2023-07-03

## 2023-07-03 MED ORDER — ondansetron (ZOFRAN) injection
4 | INTRAMUSCULAR | Status: DC | PRN
Start: 2023-07-03 — End: 2023-07-03
  Administered 2023-07-03: 16:00:00 4 mg/2 mL via INTRAVENOUS

## 2023-07-03 MED FILL — MARCAINE-EPINEPHRINE (PF) 0.25 %-1:200,000 INJECTION SOLUTION: 0.25 %-1:200,000 | INTRAMUSCULAR | Qty: 30

## 2023-07-03 MED FILL — FENTANYL (PF) 50 MCG/ML INJECTION SOLUTION: 50 ug/mL | INTRAMUSCULAR | Qty: 2

## 2023-07-03 MED FILL — HYDROCODONE 5 MG-ACETAMINOPHEN 325 MG TABLET: 5-325 mg | ORAL | 3 days supply | Qty: 15 | Fill #0

## 2023-07-03 NOTE — OR Nursing (Signed)
Pt arrived in recovery and report taken from OR nurse and anesthesia. VS obtained and monitored throughout recovery. Pt stable throughout recovery. Pt drinking and tolerating fluids w/no nausea. Pt stable for discharge home and all instructions reviewed w/patient verbally and written instructions given. Pt voices understanding. All questions answered. Pt dressed with minimal assist, and is steady, balanced with being up. Discharging per protocol with all belongings. Wheelchair to car w/one RN assist.

## 2023-07-03 NOTE — Anesthesia Post-Procedure Evaluation (Signed)
Patient Name: Richard Harrison  Procedures performed: Procedure(s):  LATERAL INTERNAL SPHINCTEROTOMY    Last Vitals:   Vitals Value Taken Time   BP 111/71 07/03/23 1010   Pulse 76 07/03/23 1010   Resp 14 07/03/23 1010   SpO2 98 % 07/03/23 1010   Temp 36.3 ?C 07/03/23 0950       Planned Anesthesia Type: general  Final Anesthesia Type: general  Patients Current Location: PACU  Level of Consciousness: sedated  Post Procedure Pain:adequate analgesia  Airway: Patent  Respiratory Status: nonlabor ventilation, unassisted, face mask, oral airway and spontaneous ventilation  Cardio Status: hemodynamically stable  Hydration: adequately hydrated  PONV prophylaxis Ordered  PONV? NO  no anesthesia complication      planned opioid use           The patient was unable to participate in the post op evaluation. Please see comments below.     Comments:        Malachy Chamber, CRNA  10:18 AM

## 2023-07-03 NOTE — Anesthesia Pre-Procedure Evaluation (Signed)
Anesthesia Evaluation     Patient summary reviewed      Airway   Mallampati: I  Neck ROM: full  Dental    (+) age appropriate    Pulmonary - negative ROS and normal exam    breath sounds clear to auscultation  Cardiovascular - normal exam  (+) hypertension    Rhythm: regular  Rate: normal    Neuro/Psych    (+) psychiatric history (Bipolar)    GI/Hepatic/Renal    (+) chronic renal disease (h/o nephrolithiasis)obesity    Endo/Other - negative ROS    Musculoskeletal - negative ROS                     Anesthesia Plan    ASA 2     general     intravenous induction     Patient is not a smoker          Anesthetic plan, risks and benefits discussed with patient.  Risks discussed included (but were not limited to)   General risks dental injury, drug reaction, perioprative CV events, nausea, pain, respiratory events and sore throatConsenting person understands and agrees to proceed. PARQ.

## 2023-07-03 NOTE — H&P (Signed)
Expand All Collapse All    CC: Anal fissure     HPI:     This is a pleasant 52 y.o. male who was referred by Primus, Tomasa Hose, MD for  a chronic anal fissure.  It has been present for about 6 months.  He is gone through multiple rounds of nitroglycerin ointment and recently underwent Botox injections.  He continues to have significant pain with bowel movements as well as rectal bleeding.  He has not had a colonoscopy previously but this is scheduled once his fissure has resolved.     PMH:          Past Medical History:   Diagnosis Date    Bipolar affective disorder (HCC)      Hypertension      Kidney stone        No family history on file.        Past Surgical History:   Procedure Laterality Date    PROCEDURE N/A 03/24/2014     Procedure: CYSTOSCOPY; URETEROSCOPY; LASER HOLMIUM LITHOTRIPSY; STENT PLACEMENT;  Surgeon: Marton Redwood, MD;  Location: [facility] OR;  Service: Urology;  Laterality: N/A;      Social History               Socioeconomic History    Marital status: Divorced       Spouse name: Not on file    Number of children: Not on file    Years of education: Not on file    Highest education level: Not on file   Occupational History    Not on file   Tobacco Use    Smoking status: Never    Smokeless tobacco: Never   Substance and Sexual Activity    Alcohol use: Yes       Alcohol/week: 1.0 - 4.0 standard drink of alcohol       Types: 1 - 4 Cans of beer per week       Comment: 2-3 X/mo    Drug use: Yes       Frequency: 1.0 times per week       Types: Marijuana    Sexual activity: Yes       Partners: Female   Other Topics Concern    Not on file   Social History Narrative    Not on file      Social Determinants of Health           Financial Resource Strain: Low Risk  (01/29/2022)     Financial Resource Strain      Difficulty of Paying Living Expenses: Not on file      Access to Reliable Phone: Not on file   Food Insecurity: Not on file   Transportation Needs: Not on file   Physical Activity: Not on file   Stress: Not on file    Social Connections: Not on file   Intimate Partner Violence: Unknown (01/29/2022)     Intimate Partner Violence      Fear of Current or Ex-Partner: Not on file      Emotionally Abused: Not on file      Physically Abused: Not on file      Sexually Abused: Not on file      Questions Apply to Other Individual: Not on file   Housing Stability: Not on file               Encounter Medications          Outpatient Encounter Medications as  of 05/29/2023   Medication Sig Dispense Refill    buPROPion ER (WELLBUTRIN XL) 150 mg 24 hr tablet Take 150 mg by mouth once daily.        dilTIAZem ER (CARDIZEM CD) 120 mg 24 hr capsule Take 1 capsule by mouth once daily. 30 capsule 0    HYDROcodone-acetaminophen (NORCO) 10-325 mg per tablet Take 1 tablet by mouth every 6 (six) hours as needed for Pain. 10 tablet 0    lamoTRIgine (LAMICTAL) 150 MG tablet Take 1 tablet (150 mg total) by mouth nightly for 30 days. 30 tablet 0    lidocaine (XYLOCAINE) 2 % jelly Apply topically every 4 hours as needed. Apply around anus and into rectum with gloved hand every 4 hours as needed for pain 30 mL 0    lithium (LITHOBID) 300 MG CR tablet Take 1 tablet (300 mg total) by mouth every 12 (twelve) hours for 30 days. 60 tablet 0    meloxicam (MOBIC) 15 MG tablet No more than once daily by mouth for pain, 15 tablet 0    nitroglycerin 0.4 % (w/w) Oint Place 0.5 inches rectally 2 times daily. 30 g 0    prazosin (MINIPRESS) 1 MG capsule Take 3 capsules (3 mg total) by mouth nightly for 30 days. 90 capsule 0    QUEtiapine (SEROQUEL XR) 400 MG 24 hr tablet Take 400 mg by mouth nightly.        tamsulosin (FLOMAX) 0.4 mg Cap Take 1 capsule by mouth every night at bedtime. 90 capsule 3    traZODone (DESYREL) 100 MG tablet Take 200 mg by mouth every night at bedtime.          No facility-administered encounter medications on file as of 05/29/2023.               Allergies   Allergen Reactions    Penicillins Anaphylaxis       As a child, per patient, states near death  experience.            Review of Systems        Physical Exam  Constitutional:       Appearance: He is well-developed.   HENT:      Head: Normocephalic and atraumatic.   Eyes:      Conjunctiva/sclera: Conjunctivae normal.   Cardiovascular:      Rate and Rhythm: Normal rate.   Pulmonary:      Effort: Pulmonary effort is normal.   Abdominal:      General: Abdomen is flat.      Palpations: Abdomen is soft.   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      Cervical back: Normal range of motion and neck supple.   Skin:     General: Skin is warm and dry.   Neurological:      Mental Status: He is alert and oriented to person, place, and time.   Psychiatric:         Mood and Affect: Mood normal.                Vitals:     05/29/23 1612   Pulse: 82   SpO2: 97%         Assessment:  Problem List Items Addressed This Visit  Anal fissure - Primary         No follow-ups on file.     Plan:  ---The etiology of the problem has been discussed and explained.  I have described the various treatment options with my recommendations and a handout has been provided.  All of their questions have been answered and discussed.  ---I have recommended that the patient undergo the discussed surgical procedure.  ---A PARQ discussion detailing the risks, benefits, and alternative options for the procedure have been discussed in detail.  We discussed the risks of bleeding, infection, incontinence, recurrence, and failure to resolve the issue.  Post operative care and recovery were explained.  All of the questions have been answered and discussed.  ASA Class II  ---This note was, in part, created with the aid of voice recognition software.  --- We have discussed the findings on his physical examination today.  He does have an anal fissure that appears acute on chronic.  It does appear  to have healed multiple times in the past and subsequently developed a subcutaneous fistula component to it in the posterior midline.  I suspect this is why it has been difficult to get this to fully heal with his other modalities.  I have recommended a lateral internal sphincterotomy and we have reviewed the risk, benefits and possible complications associated with that procedure.  I will also clean up the subcutaneous fistula at the same time.  All of his questions have been answered today.  I will see him back for the procedure.     ---He will be set up for a procedure:  ---Lateral internal sphincterotomy--[id number]

## 2023-07-03 NOTE — Op Note (Signed)
DATE OF PROCEDURE:  07/03/2023    PREOPERATIVE DIAGNOSES:  1.  Anal fissure, posterior midline.  2.  Anal fistula, posterior midline.    POSTOPERATIVE DIAGNOSES:  1.  Subcutaneous anal fistula associated with anal fissure, posterior midline.  2.  Chronic appearing anal fissure, posterior midline.  3.  Melanosis coli.    PROCEDURES:  1.  Subcutaneous fistulotomy.  2.  Lateral internal sphincterotomy, left side.    SURGEON:  RAnder Purpura, DO    ANESTHESIA:  General and local.    ESTIMATED BLOOD LOSS:  Less than 5 mL.    INDICATIONS:  The patient is a pleasant 52 year old male, who had presented to   my office with complaints of anorectal pain and discomfort.  Evaluation revealed  a chronic-appearing anal fissure associated with an anal fistula.  He has had   multiple rounds of nitroglycerin ointment as well as Botox injections without   subsequent defect.  I recommended a lateral internal sphincterotomy as well as a  fistulotomy.  We have reviewed the risks, benefits and possible complications   and consent has been obtained.    DESCRIPTION OF PROCEDURE:  The patient was brought into the operating room and   after induction of general anesthesia and a timeout, was placed in the prone   jackknife position where he was prepped and draped in the usual sterile fashion.   We began by making a cursory examination of the external anus.  He did have   some hemorrhoids externally as well as some internal hemorrhoids and   hypertrophic anal papilla consistent with chronic constipation.  The posterior   midline was an external opening consistent with a subcutaneous anal fistula that  was easily cannulated with a fistula probe.  There was very little to no muscle   involved.  I elected to open this up with the cautery.  This was done and then   the granulation tissue was fulgurated.  A Fansler anoscope was then carefully   inserted up inside.  We were able to identify a significant staining of the   mucosa in the rectum  consistent with melanosis coli.  I then performed a limited  lateral internal sphincterotomy making an incision in the intersphincteric   groove on the left lateral side.  I was able to dissect free the internal   sphincter muscle without difficulty.  A small portion was then divided with   tenotomy scissors and the defect was closed with a 3-0 chromic suture.  Marcaine  0.25% with epinephrine was then injected in and around the areas of operation.    Sterile dressings were applied over the top.  The patient was then awakened,   extubated and transported back to the recovery room in stable condition.  All   sponge and needle counts were correct at the end of the case.    Tawny Hopping, DO    RSN/sal  D:  07/03/2023  9:47 A   T:  07/03/2023  10:12 A   TID:  [id number]  RECEIPT:    [id number]

## 2023-07-14 NOTE — Telephone Encounter (Signed)
Called pt to move PO appt from 7/25 to 7/24, left message.

## 2023-07-15 ENCOUNTER — Ambulatory Visit: Admit: 2023-07-15 | Payer: MEDICARE | Attending: DO

## 2023-07-15 DIAGNOSIS — K602 Anal fissure, unspecified: Secondary | ICD-10-CM

## 2023-07-15 NOTE — Progress Notes (Signed)
HPI:    This is a pleasant 52 y.o. male who is being seen in the office today following a lateral internal sphincterotomy.  He is now 2 weeks from the procedure. He states that the pain is improved.  There is not drainage from the wound.  His bowel function has returned to normal.  We discussed when he could return to normal activity.  He is doing well today.  The incisions are healing nicely and his pain is improving significantly.      PMH:    Past Medical History:   Diagnosis Date    Bipolar affective disorder (HCC)     Hypertension     Kidney stone      No family history on file.  Past Surgical History:   Procedure Laterality Date    ANAL SPHINCTEROTOMY N/A 07/03/2023    Procedure: LATERAL INTERNAL SPHINCTEROTOMY;  Surgeon: Meliton Rattan, DO;  Location: [facility] OR;  Service: General Surgery;  Laterality: N/A;    KNEE SURGERY Left     x2    PROCEDURE N/A 03/24/2014    Procedure: CYSTOSCOPY; URETEROSCOPY; LASER HOLMIUM LITHOTRIPSY; STENT PLACEMENT;  Surgeon: Marton Redwood, MD;  Location: [facility] OR;  Service: Urology;  Laterality: N/A;     Social History     Social History Narrative    Not on file       Outpatient Medications Prior to Visit   Medication Sig Dispense Refill    buPROPion ER (WELLBUTRIN XL) 150 mg 24 hr tablet Take 150 mg by mouth once daily.      dilTIAZem ER (CARDIZEM CD) 120 mg 24 hr capsule Take 1 capsule by mouth once daily. 30 capsule 0    docusate sodium (COLACE) 250 mg capsule Take 1 capsule by mouth daily as needed for Constipation.      ibuprofen (ADVIL) 600 mg tablet Take 1 tablet by mouth every 4 hours as needed for pain. 30 tablet 0    lamoTRIgine (LAMICTAL) 150 MG tablet Take 1 tablet (150 mg total) by mouth nightly for 30 days. 30 tablet 0    prazosin (MINIPRESS) 1 MG capsule Take 3 capsules (3 mg total) by mouth nightly for 30 days. 90 capsule 0    QUEtiapine (SEROQUEL XR) 400 MG 24 hr tablet Take 400 mg by mouth nightly.      traZODone (DESYREL) 100 MG tablet Take 200 mg by mouth every night  at bedtime.       No facility-administered medications prior to visit.     Allergies   Allergen Reactions    Penicillins Anaphylaxis     As a child, per patient, states near death experience.         Review of Systems    Vitals:    07/15/23 0948   Pulse: 86   SpO2: 96%       Physical Exam  Constitutional:       Appearance: He is well-developed.   HENT:      Head: Normocephalic and atraumatic.   Eyes:      Conjunctiva/sclera: Conjunctivae normal.   Cardiovascular:      Rate and Rhythm: Normal rate.   Pulmonary:      Effort: Pulmonary effort is normal.   Abdominal:      General: Abdomen is flat.      Palpations: Abdomen is soft.   Genitourinary:     Comments: Incisions are closing well.  The fissure is almost healed.  Musculoskeletal:  General: Normal range of motion.      Cervical back: Normal range of motion and neck supple.   Skin:     General: Skin is warm and dry.   Neurological:      Mental Status: He is alert and oriented to person, place, and time.   Psychiatric:         Mood and Affect: Mood normal.           Assessment:  Problem List Items Addressed This Visit       Anal fissure - Primary    Anal fistula       No follow-ups on file.    Plan:  ---He is doing well after the procedure.  The wounds are healing, and they may return back to their regular activities.  ---This note was, in part, created with the aid of voice recognition software.  --- He is doing well following the operation.  I will see him back in 4 weeks if he is having any further problems or difficulties.

## 2023-08-10 DIAGNOSIS — E7800 Pure hypercholesterolemia, unspecified: Secondary | ICD-10-CM | POA: Diagnosis not present

## 2023-08-10 DIAGNOSIS — G250 Essential tremor: Secondary | ICD-10-CM | POA: Diagnosis not present

## 2023-08-10 DIAGNOSIS — E039 Hypothyroidism, unspecified: Secondary | ICD-10-CM | POA: Diagnosis not present

## 2023-08-10 DIAGNOSIS — R030 Elevated blood-pressure reading, without diagnosis of hypertension: Secondary | ICD-10-CM | POA: Diagnosis not present

## 2023-08-10 DIAGNOSIS — R7309 Other abnormal glucose: Secondary | ICD-10-CM | POA: Diagnosis not present

## 2023-11-10 NOTE — Progress Notes (Signed)
Formatting of this note is different from the original.  Reason for Visit:  Chief Complaint   Patient presents with    Medicare Annual Exam     No concerns at this time     Subjective   Richard Harrison is here for his annual wellness visit for Medicare.  This is not his for his IPPE or first.  Had lab work recently through the [place] and with behavioral health and can get [place] copies of that fasting lab work.  Usually is fairly extensive through the [place].  His colonoscopy is set up for January of next year.  He has no new concerns today.  Does continue to follow with behavioral health and would like a handicap parking permit with chronic knee issues.          Review/updated: Problem List   Histories   Meds   Chart Review      Preventative/Wellness Visit: Richard Harrison is a 52 y.o. male   Due for IPPE ([facility]) evaluation.    Health Care Team Members     Patient Care Team:  Deloris Ping, DO as PCP - General (Family Medicine)    Medicare Wellness Screenings:   - Health Risk Assessment Concerns:   (HRA Full Report)    Patient's overall HEALTH is reported as FAIR  Patient's STRESS is ALWAYS a problem  Patient's PAIN is USUALLY a problem  Patient does NOT EXERCISE on a regular basis  Patient has DIFFICULTY with WALKING or getting around  Patient's has DIFFICULTY with VISION  Patient's has DIFFICULTY with HEARING  Patient has DENTAL care DIFFICULTY with teeth or gums  Patient has been INCONTINENT in the past 6 months  Patient has NOCTURIA - gets up to use the bathroom at least 2x per night  Patient has NO BATH MATS or NON SLIP surfaces in tub/shower  Patient needs HELP managing FINANCES  Does NOT have AMD (Advance Medical Directives)    - Fall Risk Assessment:   Patient is at HIGH Risk for Falls per Fall Screening  - ambulatory when last recorded - Yes (11/10/2023  2:04 PM)    Fall Risk Assessment   Steady gait observed by staff   Patient feels Unsteady when walking or standing   Patient reports multiple falls this past  year     The following medications prescribed during the last 365 days are considered to be High Medication Risk for falls:      - Depression Screen:   Positive Screening based on last PHQ-9 Total Score: 20 (11/10/2023  2:04 PM)   PHQ-9 Report    Update data    Follow up for Positive Depression Screen:   - Care: Patient expressed understanding of self-monitoring and will follow up with PCP or Mental Health Care clinician as necessary    - Lifestyle Efforts:  Exercise  Generally active lifestyle, but no structured exercise    Diet  healthy food choices    Smoking / Alcohol Use / Drug Use  He reports that he has never smoked. He has never used smokeless tobacco. He reports current alcohol use. He reports that he does not use drugs.   ROS completed - negative    Current Outpatient Medications   Medication Instructions    acetaminophen (TYLENOL) 325 mg, EVERY 4 HOURS PRN    buPROPion XL (WELLBUTRIN XL) 150 mg    Cariprazine HCl (VRAYLAR) 3 MG capsule Take by mouth.    divalproex (DEPAKOTE ER) 1,500 mg  Docusate Sodium (DSS) 250 MG CAPS 1 capsule, DAILY PRN    hydrOXYzine (ATARAX) 25 mg, Oral, NIGHTLY    ibuprofen 600 MG tablet 1 tablet, EVERY 4 HOURS PRN    lamoTRIgine (LAMICTAL) 200 MG tablet 1 tablet, DAILY    meloxicam (MOBIC) 7.5 mg, DAILY    OLANZapine (ZYPREXA) 5 mg, NIGHTLY    prazosin (MINIPRESS) 6 mg, NIGHTLY     Objective   BP 118/72   Pulse 87   Temp 36.2 ?C (97.2 ?F)   Resp 14   Ht 170.2 cm (5' 7)   Wt 104.3 kg (230 lb)   SpO2 98%   BMI 36.02 kg/m?     Vision Acuity  Vision Screening    Right eye Left eye Both eyes   Without correction      With correction 20/40 20/40 20/25      Cognition  Patient is oriented to person, place, & time.  Physical Exam  Vitals and nursing note reviewed.   Constitutional:       Appearance: Normal appearance. He is well-developed. He is obese.   HENT:      Head: Normocephalic and atraumatic.      Right Ear: Tympanic membrane, ear canal and external ear normal.      Left  Ear: Tympanic membrane, ear canal and external ear normal.      Nose: Nose normal.      Mouth/Throat:      Mouth: Mucous membranes are moist.      Pharynx: Oropharynx is clear.   Eyes:      General: No scleral icterus.     Extraocular Movements: Extraocular movements intact.      Conjunctiva/sclera: Conjunctivae normal.      Pupils: Pupils are equal, round, and reactive to light.   Cardiovascular:      Rate and Rhythm: Normal rate and regular rhythm.      Pulses: Normal pulses.      Heart sounds: Normal heart sounds. No murmur heard.  Pulmonary:      Effort: Pulmonary effort is normal. No respiratory distress.      Breath sounds: Normal breath sounds.   Abdominal:      General: Abdomen is flat. Bowel sounds are normal. There is no distension.      Palpations: Abdomen is soft. There is no mass.      Tenderness: There is no abdominal tenderness. There is no right CVA tenderness, left CVA tenderness, guarding or rebound.      Hernia: No hernia is present.   Musculoskeletal:         General: Normal range of motion.      Cervical back: Normal range of motion and neck supple. No tenderness.      Right lower leg: No edema.      Left lower leg: No edema.   Lymphadenopathy:      Cervical: No cervical adenopathy.   Skin:     General: Skin is warm and dry.      Capillary Refill: Capillary refill takes less than 2 seconds.   Neurological:      General: No focal deficit present.      Mental Status: He is alert.      Motor: No weakness.      Gait: Gait normal.   Psychiatric:         Mood and Affect: Mood normal.         Behavior: Behavior normal.         Thought  Content: Thought content normal.         Judgment: Judgment normal.       Not indicated    Health Maintenance reviewed this visit:     Colorectal cancer screening:   No prior reports available for Colorectal Screening.  Cholesterol screening: No prior lipid panel.    Immunizations: All immunizations addressed.    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     Richard Harrison was provided his Medicare Personalized Preventative Plan within After Visit Summary (Patient Instructions).    Follow up in 1 year (on 11/09/2024) for Medicare Annual Wellness Visit.         Get a copy of his most recent lab work for review from the [place].  Continue to follow with behavioral health.  Paperwork for handicap placard  Keep upcoming appointment for colon screening update  Work on healthy active lifestyle increase exercise and activity and weight loss plan  Electronically signed by Deloris Ping, DO at 11/10/2023  3:27 PM CST

## 2023-11-16 DIAGNOSIS — F4323 Adjustment disorder with mixed anxiety and depressed mood: Secondary | ICD-10-CM | POA: Diagnosis not present

## 2023-12-21 DIAGNOSIS — F4323 Adjustment disorder with mixed anxiety and depressed mood: Secondary | ICD-10-CM | POA: Diagnosis not present

## 2023-12-28 DIAGNOSIS — F418 Other specified anxiety disorders: Secondary | ICD-10-CM | POA: Diagnosis not present

## 2024-01-26 DIAGNOSIS — R051 Acute cough: Secondary | ICD-10-CM | POA: Diagnosis not present

## 2024-02-15 DIAGNOSIS — Z0000 Encounter for general adult medical examination without abnormal findings: Secondary | ICD-10-CM | POA: Diagnosis not present

## 2024-02-15 DIAGNOSIS — Z131 Encounter for screening for diabetes mellitus: Secondary | ICD-10-CM | POA: Diagnosis not present

## 2024-02-15 DIAGNOSIS — F418 Other specified anxiety disorders: Secondary | ICD-10-CM | POA: Diagnosis not present

## 2024-02-15 DIAGNOSIS — E039 Hypothyroidism, unspecified: Secondary | ICD-10-CM | POA: Diagnosis not present

## 2024-02-15 DIAGNOSIS — Z125 Encounter for screening for malignant neoplasm of prostate: Secondary | ICD-10-CM | POA: Diagnosis not present

## 2024-02-15 DIAGNOSIS — N529 Male erectile dysfunction, unspecified: Secondary | ICD-10-CM | POA: Diagnosis not present

## 2024-02-15 DIAGNOSIS — Z13220 Encounter for screening for lipoid disorders: Secondary | ICD-10-CM | POA: Diagnosis not present

## 2024-04-18 DIAGNOSIS — E039 Hypothyroidism, unspecified: Secondary | ICD-10-CM | POA: Diagnosis not present
